# Patient Record
Sex: Male | Born: 1965 | Race: White | Hispanic: No | Smoking: Current every day smoker
Health system: Southern US, Community
[De-identification: ages and names within clinical notes are randomized; demographics above are authoritative.]

## PROBLEM LIST (undated history)

## (undated) DIAGNOSIS — G629 Polyneuropathy, unspecified: Secondary | ICD-10-CM

## (undated) DIAGNOSIS — S82891A Other fracture of right lower leg, initial encounter for closed fracture: Secondary | ICD-10-CM

## (undated) HISTORY — PX: FACIAL RECONSTRUCTION SURGERY: SHX631

## (undated) HISTORY — PX: OTHER SURGICAL HISTORY: SHX169

---

## 2007-05-06 ENCOUNTER — Emergency Department: Payer: Self-pay | Admitting: Internal Medicine

## 2007-07-28 ENCOUNTER — Other Ambulatory Visit: Payer: Self-pay

## 2007-07-28 ENCOUNTER — Emergency Department: Payer: Self-pay | Admitting: Emergency Medicine

## 2010-06-12 ENCOUNTER — Ambulatory Visit: Payer: Self-pay | Admitting: Family Medicine

## 2010-08-01 ENCOUNTER — Emergency Department: Payer: Self-pay | Admitting: Unknown Physician Specialty

## 2011-04-06 ENCOUNTER — Emergency Department: Payer: Self-pay | Admitting: *Deleted

## 2011-04-06 LAB — CK TOTAL AND CKMB (NOT AT ARMC): CK-MB: 1.4 ng/mL (ref 0.5–3.6)

## 2011-04-06 LAB — BASIC METABOLIC PANEL
Anion Gap: 10 (ref 7–16)
Calcium, Total: 9.8 mg/dL (ref 8.5–10.1)
Co2: 25 mmol/L (ref 21–32)
Creatinine: 0.88 mg/dL (ref 0.60–1.30)
EGFR (African American): >60
EGFR (Non-African Amer.): >60
Sodium: 141 mmol/L (ref 136–145)

## 2011-04-06 LAB — CBC
HCT: 44.2 % (ref 40.0–52.0)
HGB: 15.4 g/dL (ref 13.0–18.0)
MCH: 31.9 pg (ref 26.0–34.0)
MCHC: 34.8 g/dL (ref 32.0–36.0)
MCV: 92 fL (ref 80–100)
Platelet: 216 10*3/uL (ref 150–440)
RBC: 4.82 10*6/uL (ref 4.40–5.90)
WBC: 8.8 10*3/uL (ref 3.8–10.6)

## 2011-04-06 LAB — TROPONIN I: Troponin-I: <0.02 ng/mL

## 2014-08-08 ENCOUNTER — Inpatient Hospital Stay
Admit: 2014-08-08 | Discharge: 2014-08-08 | Disposition: A | Discharge status: Home / Self Care | Payer: Self-pay | Attending: Internal Medicine | Admitting: Internal Medicine

## 2014-08-08 ENCOUNTER — Inpatient Hospital Stay
Admission: EM | Admit: 2014-08-08 | Discharge: 2014-08-09 | DRG: 313 | Disposition: A | Discharge status: Home / Self Care | Payer: Self-pay | Attending: Internal Medicine | Admitting: Internal Medicine

## 2014-08-08 ENCOUNTER — Emergency Department: Payer: Self-pay

## 2014-08-08 DIAGNOSIS — R079 Chest pain, unspecified: Principal | ICD-10-CM | POA: Diagnosis present

## 2014-08-08 DIAGNOSIS — Z716 Tobacco abuse counseling: Secondary | ICD-10-CM | POA: Diagnosis present

## 2014-08-08 DIAGNOSIS — F17210 Nicotine dependence, cigarettes, uncomplicated: Secondary | ICD-10-CM | POA: Diagnosis present

## 2014-08-08 DIAGNOSIS — Z885 Allergy status to narcotic agent status: Secondary | ICD-10-CM

## 2014-08-08 DIAGNOSIS — Z880 Allergy status to penicillin: Secondary | ICD-10-CM

## 2014-08-08 DIAGNOSIS — Z8249 Family history of ischemic heart disease and other diseases of the circulatory system: Secondary | ICD-10-CM

## 2014-08-08 DIAGNOSIS — Z720 Tobacco use: Secondary | ICD-10-CM | POA: Diagnosis present

## 2014-08-08 DIAGNOSIS — R61 Generalized hyperhidrosis: Secondary | ICD-10-CM | POA: Diagnosis present

## 2014-08-08 DIAGNOSIS — R0602 Shortness of breath: Secondary | ICD-10-CM | POA: Diagnosis present

## 2014-08-08 LAB — LIPID PANEL
Cholesterol: 143 mg/dL (ref 0–200)
HDL: 38 mg/dL — AB (ref >40)
LDL Cholesterol: 84 mg/dL (ref 0–99)
Total CHOL/HDL Ratio: 3.8 RATIO
Triglycerides: 105 mg/dL (ref <150)
VLDL: 21 mg/dL (ref 0–40)

## 2014-08-08 LAB — TROPONIN I
Troponin I: <0.03 ng/mL (ref <0.031)
Troponin I: <0.03 ng/mL (ref <0.031)
Troponin I: <0.03 ng/mL (ref <0.031)

## 2014-08-08 LAB — BASIC METABOLIC PANEL
Anion gap: 9 (ref 5–15)
BUN: 12 mg/dL (ref 6–20)
CALCIUM: 8.9 mg/dL (ref 8.9–10.3)
CO2: 22 mmol/L (ref 22–32)
Chloride: 103 mmol/L (ref 101–111)
Creatinine, Ser: 0.76 mg/dL (ref 0.61–1.24)
GFR calc Af Amer: >60 mL/min (ref >60)
GFR calc non Af Amer: >60 mL/min (ref >60)
Glucose, Bld: 118 mg/dL — ABNORMAL HIGH (ref 65–99)
Potassium: 4 mmol/L (ref 3.5–5.1)
Sodium: 134 mmol/L — ABNORMAL LOW (ref 135–145)

## 2014-08-08 LAB — TSH: TSH: 1.989 u[IU]/mL (ref 0.350–4.500)

## 2014-08-08 LAB — CBC
HEMATOCRIT: 43.6 % (ref 40.0–52.0)
Hemoglobin: 15 g/dL (ref 13.0–18.0)
MCH: 31.5 pg (ref 26.0–34.0)
MCHC: 34.5 g/dL (ref 32.0–36.0)
MCV: 91.2 fL (ref 80.0–100.0)
Platelets: 206 10*3/uL (ref 150–440)
RBC: 4.78 MIL/uL (ref 4.40–5.90)
RDW: 13.5 % (ref 11.5–14.5)
WBC: 10.4 10*3/uL (ref 3.8–10.6)

## 2014-08-08 LAB — PROTIME-INR
INR: 0.95
PROTHROMBIN TIME: 12.9 s (ref 11.4–15.0)

## 2014-08-08 LAB — APTT: aPTT: 29 seconds (ref 24–36)

## 2014-08-08 LAB — HEPARIN LEVEL (UNFRACTIONATED): Heparin Unfractionated: 0.26 IU/mL — ABNORMAL LOW (ref 0.30–0.70)

## 2014-08-08 MED ORDER — SODIUM CHLORIDE 0.9 % IJ SOLN: 3.0000 mL | INTRAMUSCULAR | Status: DC | PRN

## 2014-08-08 MED ORDER — SODIUM CHLORIDE 0.9 % IV SOLN: 250.0000 mL | INTRAVENOUS | Status: DC | PRN

## 2014-08-08 MED ORDER — ONDANSETRON HCL 4 MG PO TABS: 4.0000 mg | ORAL_TABLET | Freq: Four times a day (QID) | ORAL | Status: DC | PRN

## 2014-08-08 MED ORDER — ONDANSETRON HCL 4 MG/2ML IJ SOLN: 4.0000 mg | Freq: Four times a day (QID) | INTRAMUSCULAR | Status: DC | PRN

## 2014-08-08 MED ORDER — HYDROMORPHONE HCL 1 MG/ML IJ SOLN: 1.0000 mg | INTRAMUSCULAR | Status: DC | PRN

## 2014-08-08 MED ORDER — SODIUM CHLORIDE 0.9 % IJ SOLN: 3.0000 mL | Freq: Two times a day (BID) | INTRAMUSCULAR | Status: DC

## 2014-08-08 MED ORDER — PNEUMOCOCCAL VAC POLYVALENT 25 MCG/0.5ML IJ INJ: 0.5000 mL | INJECTION | INTRAMUSCULAR | Status: DC

## 2014-08-08 MED ORDER — METOPROLOL TARTRATE 25 MG PO TABS
12.5000 mg | ORAL_TABLET | Freq: Two times a day (BID) | ORAL | Status: DC
  Administered 2014-08-08: 12.5 mg via ORAL
  Filled 2014-08-08: qty 1

## 2014-08-08 MED ORDER — NITROGLYCERIN 0.4 MG SL SUBL
0.4000 mg | SUBLINGUAL_TABLET | Freq: Once | SUBLINGUAL | Status: AC
  Administered 2014-08-08: 0.4 mg via SUBLINGUAL
  Filled 2014-08-08: qty 1

## 2014-08-08 MED ORDER — HEPARIN BOLUS VIA INFUSION
4000.0000 [IU] | Freq: Once | INTRAVENOUS | Status: AC
  Administered 2014-08-08: 4000 [IU] via INTRAVENOUS
  Filled 2014-08-08: qty 4000

## 2014-08-08 MED ORDER — HYDROMORPHONE HCL 1 MG/ML IJ SOLN
1.0000 mg | Freq: Once | INTRAMUSCULAR | Status: AC
  Administered 2014-08-08: 1 mg via INTRAVENOUS

## 2014-08-08 MED ORDER — HEPARIN (PORCINE) IN NACL 100-0.45 UNIT/ML-% IJ SOLN
1500.0000 [IU]/h | INTRAMUSCULAR | Status: DC
  Administered 2014-08-08: 1300 [IU]/h via INTRAVENOUS
  Filled 2014-08-08 (×4): qty 250

## 2014-08-08 MED ORDER — HYDROMORPHONE HCL 1 MG/ML IJ SOLN
1.0000 mg | Freq: Once | INTRAMUSCULAR | Status: AC
  Administered 2014-08-08: 1 mg via INTRAVENOUS
  Filled 2014-08-08: qty 1

## 2014-08-08 MED ORDER — ACETAMINOPHEN 650 MG RE SUPP: 650.0000 mg | Freq: Four times a day (QID) | RECTAL | Status: DC | PRN

## 2014-08-08 MED ORDER — ONDANSETRON HCL 4 MG/2ML IJ SOLN
4.0000 mg | Freq: Once | INTRAMUSCULAR | Status: AC
  Administered 2014-08-08: 4 mg via INTRAVENOUS

## 2014-08-08 MED ORDER — HYDROMORPHONE HCL 1 MG/ML IJ SOLN
INTRAMUSCULAR | Status: AC
  Administered 2014-08-08: 1 mg via INTRAVENOUS
  Filled 2014-08-08: qty 1

## 2014-08-08 MED ORDER — ASPIRIN 81 MG PO CHEW
324.0000 mg | CHEWABLE_TABLET | Freq: Once | ORAL | Status: AC
  Administered 2014-08-08: 324 mg via ORAL
  Filled 2014-08-08: qty 4

## 2014-08-08 MED ORDER — ACETAMINOPHEN 325 MG PO TABS: 650.0000 mg | ORAL_TABLET | Freq: Four times a day (QID) | ORAL | Status: DC | PRN

## 2014-08-08 MED ORDER — ASPIRIN EC 81 MG PO TBEC: 81.0000 mg | DELAYED_RELEASE_TABLET | Freq: Every day | ORAL | Status: DC

## 2014-08-08 MED ORDER — ONDANSETRON HCL 4 MG/2ML IJ SOLN
INTRAMUSCULAR | Status: AC
  Administered 2014-08-08: 4 mg via INTRAVENOUS
  Filled 2014-08-08: qty 2

## 2014-08-08 MED ORDER — HEPARIN BOLUS VIA INFUSION
1500.0000 [IU] | Freq: Once | INTRAVENOUS | Status: AC
  Administered 2014-08-08: 1500 [IU] via INTRAVENOUS
  Filled 2014-08-08: qty 1500

## 2014-08-08 NOTE — ED Notes (Signed)
Patient transported to X-ray 

## 2014-08-08 NOTE — Progress Notes (Signed)
Patient alert and oriented x4. Oriented to room, unit, and call bell. Admission completed. No complaints at this time. Will cont to assess. Skin assessment verified by Mya Woodard, RN. Telemetry box verified. Manny Vitolo R [place]  

## 2014-08-08 NOTE — ED Notes (Signed)
Admitting MD at bedside.

## 2014-08-08 NOTE — Progress Notes (Signed)
ANTICOAGULATION CONSULT NOTE - Follow up Consult  Pharmacy Consult for Heparin Indication: chest pain/ACS  Allergies  Allergen Reactions  . Morphine And Related Shortness Of Breath, Itching and Swelling  . Penicillins Anaphylaxis    Patient Measurements: Height:  (190.5 cm) Weight: 218 lb (98.884 kg) IBW/kg (Calculated) : 84.5 Heparin Dosing Weight: 98.9 kg  Vital Signs: Temp: 97.9 F (36.6 C) (08/02 2004) Temp Source: Oral (08/02 1556) BP: 129/75 mmHg (08/02 2004) Pulse Rate: 117 (08/02 2004)  Labs:  Recent Labs  08/08/14 1137 08/08/14 1604 08/08/14 2008  HGB 15.0  --   --   HCT 43.6  --   --   PLT 206  --   --   APTT 29  --   --   LABPROT 12.9  --   --   INR 0.95  --   --   HEPARINUNFRC  --   --  0.26*  CREATININE 0.76  --   --   TROPONINI <0.03 <0.03  --     Estimated Creatinine Clearance: 136.4 mL/min (by C-G formula based on Cr of 0.76).   Medical History: History reviewed. No pertinent past medical history.  Medications:  Infusions:  . heparin 1,300 Units/hr (08/08/14 2000)    Assessment: 49 yo male admitted with sudden onset constant sharp chest pain radiating to jaw and shoulder.  No personal or family history of PE or DVT.  First level subtherapeutic 0.26 - RN confirmed heparin running at 13 ml/hr, no line issues, no bleeding noted  Goal of Therapy:  Heparin level 0.3-0.7 units/ml    Plan:  Will order heparin 1500 IV x1 bolus then increase drip to 1500 units/hr (=15 ml/hr) 6-h heparin level - 8/3 at 0400 CBC in AM  Crist Fat L 08/08/2014,8:53 PM

## 2014-08-08 NOTE — ED Notes (Signed)
Pt vomiting in room, states pain 6/10. MD to bedside.

## 2014-08-08 NOTE — H&P (Signed)
[facility] [place] [facility]   PATIENT NAME: Douglas Braun    MR#:  [id number]  DATE OF BIRTH:  01-06-1966  DATE OF ADMISSION:  08/08/2014  PRIMARY CARE PHYSICIAN: No PCP Per Patient   REQUESTING/REFERRING PHYSICIAN: Chari Manning  CHIEF COMPLAINT:   Chief Complaint  Patient presents with  . Chest Pain   left-sided chest pain with radiation to left jaw and shoulder with associated shortness of breath, nausea and diaphoresis.  HISTORY OF PRESENT ILLNESS:  Douglas Braun  is a 49 y.o. male with no significant past medical history except for history of tobacco usage presents to the emergency room with the complaints of acute onset of left-sided chest pain with radiation to left jaw and left shoulder with associated shortness of breath, diaphoresis and nausea which happened while he was at rest. No associated dizziness or palpitations. No history of any recent fever, chills, cough, GERD symptoms. No history of any chest pain in the past. In the emergency room patient was evaluated by the ED physician and was found to be with stable vital signs and received aspirin, sublingual nitroglycerin and IV Dilaudid following which his left-sided chest pain decreased from 10/10 -2/10. Patient has strong family history of premature coronary artery disease. Workup revealed normal labs, troponin less than 0.03, chest x-ray unremarkable for any acute cardiopulmonary pathology. EKG normal sinus rhythm with ventricular rate of 73 bpm, poor R-wave progression in anterior septal leads.  PAST MEDICAL HISTORY:  History reviewed. No pertinent past medical history.  PAST SURGICAL HISTORY:   Past Surgical History  Procedure Laterality Date  . Facial reconstruction surgery      SOCIAL HISTORY:   History  Substance Use Topics  . Smoking status: Current Every Day Smoker -- 1.00 packs/day    Types: Cigarettes  . Smokeless tobacco: Never Used  . Alcohol Use: No    FAMILY  HISTORY:   Family History  Problem Relation Age of Onset  . Heart attack Mother   . Heart attack Other     DRUG ALLERGIES:   Allergies  Allergen Reactions  . Morphine And Related Shortness Of Breath, Itching and Swelling  . Penicillins Anaphylaxis    REVIEW OF SYSTEMS:   Review of Systems  Constitutional: Negative for fever, chills and malaise/fatigue.  HENT: Negative for ear pain, hearing loss, nosebleeds, sore throat and tinnitus.   Eyes: Negative for blurred vision, double vision, pain, discharge and redness.  Respiratory: Positive for shortness of breath. Negative for cough, hemoptysis, sputum production and wheezing.   Cardiovascular: Positive for chest pain. Negative for palpitations, orthopnea and leg swelling.       With associated shortness of breath, diaphoresis, nausea.  Gastrointestinal: Negative for nausea, vomiting, abdominal pain, diarrhea, constipation, blood in stool and melena.  Genitourinary: Negative for dysuria, urgency, frequency and hematuria.  Musculoskeletal: Negative for back pain, joint pain and neck pain.  Skin: Negative for itching and rash.  Neurological: Negative for dizziness, tingling, sensory change, focal weakness and seizures.  Endo/Heme/Allergies: Does not bruise/bleed easily.  Psychiatric/Behavioral: Negative for depression. The patient is not nervous/anxious.     MEDICATIONS AT HOME:   Prior to Admission medications   Medication Sig Start Date End Date Taking? Authorizing Provider  ibuprofen (ADVIL,MOTRIN) 200 MG tablet Take 800 mg by mouth every 6 (six) hours as needed for mild pain.   Yes Historical Provider, MD      VITAL SIGNS:  Blood pressure 125/85, pulse 53, temperature 98.1 F (36.7  C), temperature source Oral, resp. rate 14, height  (1.905 m), weight 98.884 kg (218 lb), SpO2 96 %.  PHYSICAL EXAMINATION:  Physical Exam  Constitutional: He is oriented to person, place, and time. He appears well-developed and  well-nourished. No distress.  HENT:  Head: Normocephalic and atraumatic.  Right Ear: External ear normal.  Left Ear: External ear normal.  Nose: Nose normal.  Mouth/Throat: Oropharynx is clear and moist. No oropharyngeal exudate.  Eyes: EOM are normal. Pupils are equal, round, and reactive to light. No scleral icterus.  Neck: Normal range of motion. Neck supple. No JVD present. No thyromegaly present.  Cardiovascular: Normal rate, regular rhythm, normal heart sounds and intact distal pulses.  Exam reveals no friction rub.   No murmur heard. Respiratory: Effort normal and breath sounds normal. No respiratory distress. He has no wheezes. He has no rales. He exhibits no tenderness.  GI: Soft. Bowel sounds are normal. He exhibits no distension and no mass. There is no tenderness. There is no rebound and no guarding.  Musculoskeletal: Normal range of motion. He exhibits no edema.  Lymphadenopathy:    He has no cervical adenopathy.  Neurological: He is alert and oriented to person, place, and time. He has normal reflexes. He displays normal reflexes. No cranial nerve deficit. He exhibits normal muscle tone.  Skin: Skin is warm. No rash noted. No erythema.  Psychiatric: He has a normal mood and affect. His behavior is normal. Thought content normal.   LABORATORY PANEL:   CBC  Recent Labs Lab 08/08/14 1137  WBC 10.4  HGB 15.0  HCT 43.6  PLT 206   ------------------------------------------------------------------------------------------------------------------  Chemistries   Recent Labs Lab 08/08/14 1137  NA 134*  K 4.0  CL 103  CO2 22  GLUCOSE 118*  BUN 12  CREATININE 0.76  CALCIUM 8.9   ------------------------------------------------------------------------------------------------------------------  Cardiac Enzymes  Recent Labs Lab 08/08/14 1137  TROPONINI <0.03    ------------------------------------------------------------------------------------------------------------------  RADIOLOGY:  Dg Chest 2 View  08/08/2014   CLINICAL DATA:  Chest pain  EXAM: CHEST  2 VIEW  COMPARISON:  04/06/2011  FINDINGS: Cardiomediastinal silhouette is stable. No acute infiltrate or pleural effusion. No pulmonary edema. Bony thorax is unremarkable.  IMPRESSION: No active cardiopulmonary disease.   Electronically Signed   By: Natasha Mead M.D.   On: 08/08/2014 12:29    EKG:   Orders placed or performed during the hospital encounter of 08/08/14  . ED EKG within 10 minutes  . ED EKG within 10 minutes normal sinus rhythm with ventricular rate of 73 bpm, poor R-wave progression in anterior septal leads.     IMPRESSION AND PLAN:  1. Left-sided chest pain with radiation to left arm and jaw with associated shortness of breath, diaphoresis and nausea occurring at rest. Strong family history of coronary artery disease +. Patient active smoker. Chest pain concerning for acute coronary event in the setting of above risk factors. Plan: Admit to telemetry, continue IV heparin, aspirin, sublingual nitroglycerin. Start low-dose beta blocker, cycle cardiac enzymes, check lipids. Request echocardiogram and cardiac consultation for further evaluation and advice. 2. Active smoker. Counseled to quit smoking, patient not motivated this time.     All the records are reviewed and case discussed with ED provider. Management plans discussed with the patient, family and they are in agreement.  CODE STATUS: Full code  TOTAL TIME TAKING CARE OF THIS PATIENT: 50  minutes.    Crissie Figures M.D on 08/08/2014 at 2:08 PM  Between 7am  to 6pm - Pager - [phone]  After 6pm go to [email or web address] - password [email or web address] [facility]  [facility]  Office  [phone]  CC: Primary care physician; No PCP Per Patient

## 2014-08-08 NOTE — Progress Notes (Signed)
ANTICOAGULATION CONSULT NOTE - Initial Consult  Pharmacy Consult for Heparin Indication: chest pain/ACS  Allergies  Allergen Reactions  . Morphine And Related Shortness Of Breath, Itching and Swelling  . Penicillins Anaphylaxis    Patient Measurements: Height:  (190.5 cm) Weight: 218 lb (98.884 kg) IBW/kg (Calculated) : 84.5 Heparin Dosing Weight: 98.9 kg  Vital Signs: Temp: 98.1 F (36.7 C) (08/02 1131) Temp Source: Oral (08/02 1131) BP: 125/85 mmHg (08/02 1330) Pulse Rate: 53 (08/02 1330)  Labs:  Recent Labs  08/08/14 1137  HGB 15.0  HCT 43.6  PLT 206  APTT 29  LABPROT 12.9  INR 0.95  CREATININE 0.76  TROPONINI <0.03    Estimated Creatinine Clearance: 136.4 mL/min (by C-G formula based on Cr of 0.76).   Medical History: History reviewed. No pertinent past medical history.  Medications:  Infusions:  . heparin      Assessment: 49 yo male admitted with sudden onset constant sharp chest pain radiating to jaw and shoulder.  No personal or family history of PE or DVT.  Goal of Therapy:  Heparin level 0.3-0.7 units/ml    Plan:  Give 4000 units bolus x 1 Start heparin infusion at 1300 units/hr Check anti-Xa level in 6 hours and daily while on heparin Continue to monitor H&H and platelets  Terris Germano K 08/08/2014,1:53 PM

## 2014-08-08 NOTE — ED Notes (Signed)
Pt c/o sudden onset left sided chest pain that radiates into the neck with SOB, nausea, diaphoresis that started about 15-82min ago

## 2014-08-08 NOTE — Care Management (Signed)
Order present for care management consult for home health needs.  Patient presents from home.  Prior to this admission, patient independent in all adls, No previous medical history.  Admitted as inpatient with chest pain concerning for angina.  First troponin is negative.   No PCP.  There has been "no need."  Has been placed on heparin drip and cardiology consult is pending.

## 2014-08-08 NOTE — ED Provider Notes (Signed)
[facility] Emergency Department Provider Note  ____________________________________________  Time seen: Approximately 12:16 PM  I have reviewed the triage vital signs and the nursing notes.   HISTORY  Chief Complaint Chest Pain    HPI Douglas Braun is a 49 y.o. male with no chronic medical problems, tobacco use, presents for evaluation of sudden onset constant left-sided sharp chest pain radiating into the left jaw and left shoulder, beginning less than an hour ago. It is associated with shortness of breath, nausea and diaphoresis. It began at rest. Patient has never had this type of pain before. Current severity of symptoms is 10 out of 10. Pain is not ripping or tearing in nature, does not radiate to the back or down towards the feet. No modifying factors. Prior to today he had been in his usual state of health without illness, no cough, runny nose, vomiting, diarrhea, fevers or chills. No personal or family history of PE or DVT. Strong family history of coronary artery disease.   History reviewed. No pertinent past medical history.  There are no active problems to display for this patient.   Past Surgical History  Procedure Laterality Date  . Facial reconstruction surgery      No current outpatient prescriptions on file.  Allergies Penicillins and Morphine and related  No family history on file.  Social History History  Substance Use Topics  . Smoking status: Current Every Day Smoker -- 1.00 packs/day    Types: Cigarettes  . Smokeless tobacco: Never Used  . Alcohol Use: No    Review of Systems Constitutional: No fever/chills Eyes: No visual changes. ENT: No sore throat. Cardiovascular: + chest pain. Respiratory: + shortness of breath. Gastrointestinal: No abdominal pain.  + nausea, no vomiting.  No diarrhea.  No constipation. Genitourinary: Negative for dysuria. Musculoskeletal: Negative for back pain. Skin: Negative for  rash. Neurological: Negative for headaches, focal weakness or numbness.  10-point ROS otherwise negative.  ____________________________________________   PHYSICAL EXAM:  VITAL SIGNS: ED Triage Vitals  Enc Vitals Group     BP 08/08/14 1131 137/77 mmHg     Pulse Rate 08/08/14 1131 72     Resp 08/08/14 1131 24     Temp 08/08/14 1131 98.1 F (36.7 C)     Temp Source 08/08/14 1131 Oral     SpO2 08/08/14 1131 98 %     Weight 08/08/14 1131 218 lb (98.884 kg)     Height 08/08/14 1131 6\' 3"  (1.905 m)     Head Cir --      Peak Flow --      Pain Score 08/08/14 1132 6     Pain Loc --      Pain Edu? --      Excl. in GC? --     Constitutional: Alert and oriented. In distress secondary to pain. Eyes: Conjunctivae are normal. PERRL. EOMI. Head: Atraumatic. Nose: No congestion/rhinnorhea. Mouth/Throat: Mucous membranes are moist.  Oropharynx non-erythematous. Neck: No stridor.  Cardiovascular: Normal rate, regular rhythm. Grossly normal heart sounds.  Good peripheral circulation. Respiratory: Normal respiratory effort.  No retractions. Lungs CTAB. Gastrointestinal: Soft and nontender. No distention. No abdominal bruits. No CVA tenderness. Genitourinary: deferred Musculoskeletal: No lower extremity tenderness nor edema.  No joint effusions. Neurologic:  Normal speech and language. No gross focal neurologic deficits are appreciated. No gait instability. Skin:  Skin is warm, dry and intact. No rash noted. Psychiatric: Mood and affect are normal. Speech and behavior are normal.  ____________________________________________  LABS (all labs ordered are listed, but only abnormal results are displayed)  Labs Reviewed  BASIC METABOLIC PANEL - Abnormal; Notable for the following:    Sodium 134 (*)    Glucose, Bld 118 (*)    All other components within normal limits  CBC  TROPONIN I  PROTIME-INR  APTT   ____________________________________________  EKG  ED ECG REPORT I, Gayla Doss, the attending physician, personally viewed and interpreted this ECG.   Date: 08/08/2014  EKG Time: 11:28  Rate: 73  Rhythm: normal sinus rhythm  Axis: normal  Intervals:none  ST&T Change: No acute ST elevation. Poor R-wave progression in the anteroseptal leads.  ____________________________________________  RADIOLOGY  CXR FINDINGS: Cardiomediastinal silhouette is stable. No acute infiltrate or pleural effusion. No pulmonary edema. Bony thorax is unremarkable.  IMPRESSION: No active cardiopulmonary disease.  ____________________________________________   PROCEDURES  Procedure(s) performed: None  Critical Care performed: No  ____________________________________________   INITIAL IMPRESSION / ASSESSMENT AND PLAN / ED COURSE  Pertinent labs & imaging results that were available during my care of the patient were reviewed by me and considered in my medical decision making (see chart for details).  Douglas Braun is a 49 y.o. male with no chronic medical problems, tobacco use, presents for evaluation of sudden onset constant left-sided sharp chest pain radiating into the left jaw and left shoulder. On exam, he is in distress secondary to pain. EKG shows poor R-wave progression, he is a smoker has a strong family history of coronary artery disease beginning at the age of 65 therefore my concern is for ACS. Pain is not pleuritic in nature, he is not hypoxic making PE less likely. He is mildly tachypnea but I suspect this is related to pain. Pain is not ripping or tearing in nature, not radiating to the back or down towards the feet making acute aortic dissection less likely. Pain improved to 2 out of 10 at this time after Dilaudid and SL nitroglycerin. We'll continue with pain management, start heparin drip (no history of GI/cerebral hemorrhage or any other bleeding complications). Case discussed with hospitalist Dr. Betti Cruz for admission at 1:30  PM. ____________________________________________   FINAL CLINICAL IMPRESSION(S) / ED DIAGNOSES  Final diagnoses:  Chest pain, unspecified chest pain type      Gayla Doss, MD 08/08/14 1336

## 2014-08-08 NOTE — Consult Note (Signed)
[facility]  CARDIOLOGY CONSULT NOTE  Patient ID: Douglas Braun MRN: [id number] DOB/AGE: Oct 14, 1965 49 y.o.  Admit date: 08/08/2014 Referring Physician Betti Cruz Primary Physician no local M.D. Primary Cardiologist  Reason for Consultation chest pain  HPI: 49 year old gentleman referred for evaluation chest pain. Patient has no chronic medical problems, history of diabetes or hypertension. The patient does smoke. He presents with left-sided sharp discomfort with radiation to his left shoulder and jaw. Admission ECG  was nondiagnostic. The patient rated his chest pain 8 out of 10. The patient has ruled out for myocardial infarction by CPK isoenzymes and troponin. This pain has improved, now rated 3/10.  Review of systems complete and found to be negative unless listed above     History reviewed. No pertinent past medical history.  Past Surgical History  Procedure Laterality Date  . Facial reconstruction surgery      Prescriptions prior to admission  Medication Sig Dispense Refill Last Dose  . ibuprofen (ADVIL,MOTRIN) 200 MG tablet Take 800 mg by mouth every 6 (six) hours as needed for mild pain.   08/07/2014 at Unsure    History   Social History  . Marital Status: Widowed    Spouse Name: N/A  . Number of Children: N/A  . Years of Education: N/A   Occupational History  . Not on file.   Social History Main Topics  . Smoking status: Current Every Day Smoker -- 1.00 packs/day    Types: Cigarettes  . Smokeless tobacco: Never Used  . Alcohol Use: No  . Drug Use: No  . Sexual Activity: Yes   Other Topics Concern  . Not on file   Social History Narrative  . No narrative on file    Family History  Problem Relation Age of Onset  . Heart attack Mother   . Heart attack Other       Review of systems complete and found to be negative unless listed above      PHYSICAL EXAM  General: Well developed, well nourished, in no acute distress HEENT:  Normocephalic and  atramatic Neck:  No JVD.  Lungs: Clear bilaterally to auscultation and percussion. Heart: HRRR . Normal S1 and S2 without gallops or murmurs.  Abdomen: Bowel sounds are positive, abdomen soft and non-tender  Msk:  Back normal, normal gait. Normal strength and tone for age. Extremities: No clubbing, cyanosis or edema.   Neuro: Alert and oriented X 3. Psych:  Good affect, responds appropriately  Labs:   Lab Results  Component Value Date   WBC 10.4 08/08/2014   HGB 15.0 08/08/2014   HCT 43.6 08/08/2014   MCV 91.2 08/08/2014   PLT 206 08/08/2014    Recent Labs Lab 08/08/14 1137  NA 134*  K 4.0  CL 103  CO2 22  BUN 12  CREATININE 0.76  CALCIUM 8.9  GLUCOSE 118*   Lab Results  Component Value Date   TROPONINI <0.03 08/08/2014   No results found for: CHOL No results found for: HDL No results found for: LDLCALC No results found for: TRIG No results found for: CHOLHDL No results found for: LDLDIRECT    Radiology: Dg Chest 2 View  08/08/2014   CLINICAL DATA:  Chest pain  EXAM: CHEST  2 VIEW  COMPARISON:  04/06/2011  FINDINGS: Cardiomediastinal silhouette is stable. No acute infiltrate or pleural effusion. No pulmonary edema. Bony thorax is unremarkable.  IMPRESSION: No active cardiopulmonary disease.   Electronically Signed   By: Lanette Hampshire.D.  On: 08/08/2014 12:29    EKG: Normal sinus rhythm  ASSESSMENT AND PLAN:   49 year old gentleman without significant past medical history with exception tobacco abuse who presents with chest pain with typical and atypical features, nondiagnostic ECG with negative troponin.  Recommendations  1. Continue current medications 2. Proceed with ETT sestamibi study in a.m.  SignedMarcina Millard MD,PhD, [facility] 08/08/2014, 5:34 PM

## 2014-08-09 ENCOUNTER — Inpatient Hospital Stay: Payer: Self-pay

## 2014-08-09 LAB — NM MYOCAR MULTI W/SPECT W/WALL MOTION / EF
CHL CUP NUCLEAR SDS: 0
CHL CUP NUCLEAR SSS: 0
CSEPED: 6 min
CSEPEW: 7 METS
Exercise duration (sec): 0 s
LV dias vol: 134 mL
LV sys vol: 48 mL
NUC STRESS TID: 0.91
Peak HR: 113 {beats}/min
Rest HR: 57 {beats}/min
SRS: 0

## 2014-08-09 LAB — CBC
HCT: 41.6 % (ref 40.0–52.0)
Hemoglobin: 14.3 g/dL (ref 13.0–18.0)
MCH: 31.5 pg (ref 26.0–34.0)
MCHC: 34.3 g/dL (ref 32.0–36.0)
MCV: 91.6 fL (ref 80.0–100.0)
Platelets: 174 10*3/uL (ref 150–440)
RBC: 4.54 MIL/uL (ref 4.40–5.90)
RDW: 14.1 % (ref 11.5–14.5)
WBC: 8.5 10*3/uL (ref 3.8–10.6)

## 2014-08-09 LAB — TROPONIN I: Troponin I: <0.03 ng/mL (ref <0.031)

## 2014-08-09 LAB — HEPARIN LEVEL (UNFRACTIONATED): Heparin Unfractionated: 0.36 IU/mL (ref 0.30–0.70)

## 2014-08-09 MED ORDER — TECHNETIUM TC 99M SESTAMIBI - CARDIOLITE
33.0000 | Freq: Once | INTRAVENOUS | Status: AC | PRN
  Administered 2014-08-09: 31.96 via INTRAVENOUS

## 2014-08-09 MED ORDER — TECHNETIUM TC 99M SESTAMIBI - CARDIOLITE
13.0000 | Freq: Once | INTRAVENOUS | Status: AC | PRN
  Administered 2014-08-09: 13.38 via INTRAVENOUS

## 2014-08-09 NOTE — Care Management (Signed)
Troponins negative.  For ETT study today

## 2014-08-09 NOTE — Progress Notes (Signed)
Removed telemetry and removed iv.  No new rx.  Advised to quit smoking.  F/u with linthavong.

## 2014-08-09 NOTE — Discharge Summary (Signed)
[facility] [place] [facility]  DISCHARGE SUMMARY   PATIENT NAME: Douglas Braun    MR#:  [id number]  DATE OF BIRTH:  1965-03-06  DATE OF ADMISSION:  08/08/2014 ADMITTING PHYSICIAN: Crissie Figures, MD  DATE OF DISCHARGE: 08/09/2014  PRIMARY CARE PHYSICIAN: No PCP Per Patient    ADMISSION DIAGNOSIS:  Chest pain, unspecified chest pain type [R07.9]  DISCHARGE DIAGNOSIS:  Principal Problem:   Chest pain with high risk for cardiac etiology Active Problems:   Tobacco use   SECONDARY DIAGNOSIS:  History reviewed. No pertinent past medical history.  HOSPITAL COURSE:   #1 chest pain: Patient admitted with left-sided chest pain radiating to the jaw and left shoulder. He did have a risk factor of smoking and lack of primary care. He has had 3 negative sets of cardiac enzymes. EKG unchanged. No events on telemetry. He has had a treadmill stress test this morning which was low risk. 2-D echocardiogram is normal. He has ruled out for acute coronary syndrome. Chest x-ray is negative. Lipid panel is okay. He was started on a heparin drip on admission, this was discontinued after normal stress testing.  #2 ongoing tobacco abuse: He was counseled for 3-5 minutes on smoking cessation.    DISCHARGE CONDITIONS:   Stable  CONSULTS OBTAINED:  Treatment Team:  Marcina Millard, MD  DRUG ALLERGIES:   Allergies  Allergen Reactions  . Morphine And Related Shortness Of Breath, Itching and Swelling  . Penicillins Anaphylaxis    DISCHARGE MEDICATIONS:   Current Discharge Medication List    STOP taking these medications     ibuprofen (ADVIL,MOTRIN) 200 MG tablet          DISCHARGE INSTRUCTIONS:   You should stop smoking.   DIET:  Regular diet  DISCHARGE CONDITION:  Fair  ACTIVITY:  Activity as tolerated  OXYGEN:  Home Oxygen: No.   Oxygen Delivery: room air  DISCHARGE LOCATION:  home   If you experience worsening of your  admission symptoms, develop shortness of breath, life threatening emergency, suicidal or homicidal thoughts you must seek medical attention immediately by calling 911 or calling your MD immediately  if symptoms less severe.  You Must read complete instructions/literature along with all the possible adverse reactions/side effects for all the Medicines you take and that have been prescribed to you. Take any new Medicines after you have completely understood and accpet all the possible adverse reactions/side effects.   Please note  You were cared for by a hospitalist during your hospital stay. If you have any questions about your discharge medications or the care you received while you were in the hospital after you are discharged, you can call the unit and asked to speak with the hospitalist on call if the hospitalist that took care of you is not available. Once you are discharged, your primary care physician will handle any further medical issues. Please note that NO REFILLS for any discharge medications will be authorized once you are discharged, as it is imperative that you return to your primary care physician (or establish a relationship with a primary care physician if you do not have one) for your aftercare needs so that they can reassess your need for medications and monitor your lab values.   Today   CHIEF COMPLAINT:   Chief Complaint  Patient presents with  . Chest Pain    HISTORY OF PRESENT ILLNESS:   Douglas Braun is a 49 y.o. male with no significant past medical history  except for history of tobacco usage presents to the emergency room with the complaints of acute onset of left-sided chest pain with radiation to left jaw and left shoulder with associated shortness of breath, diaphoresis and nausea which happened while he was at rest. No associated dizziness or palpitations. No history of any recent fever, chills, cough, GERD symptoms. No history of any chest pain in the past. In  the emergency room patient was evaluated by the ED physician and was found to be with stable vital signs and received aspirin, sublingual nitroglycerin and IV Dilaudid following which his left-sided chest pain decreased from 10/10 -2/10. Patient has strong family history of premature coronary artery disease. Workup revealed normal labs, troponin less than 0.03, chest x-ray unremarkable for any acute cardiopulmonary pathology. EKG normal sinus rhythm with ventricular rate of 73 bpm, poor R-wave progression in anterior septal leads.  VITAL SIGNS:  Blood pressure 124/72, pulse 55, temperature 98 F (36.7 C), temperature source Oral, resp. rate 18, height 6\' 3"  (1.905 m), weight 96.752 kg (213 lb 4.8 oz), SpO2 99 %.  I/O:   Intake/Output Summary (Last 24 hours) at 08/09/14 1331 Last data filed at 08/09/14 0700  Gross per 24 hour  Intake 222.58 ml  Output      0 ml  Net 222.58 ml    PHYSICAL EXAMINATION:  GENERAL:  49 y.o.-year-old patient lying in the bed with no acute distress. Thin EYES: Pupils equal, round, reactive to light and accommodation. No scleral icterus. Extraocular muscles intact.  HEENT: Head atraumatic, normocephalic. Oropharynx and nasopharynx clear.  NECK:  Supple, no jugular venous distention. No thyroid enlargement, no tenderness.  LUNGS: Normal breath sounds bilaterally, no wheezing, rales, rhonchi or crepitation. No use of accessory muscles of respiration.  CARDIOVASCULAR: S1, S2 normal. No murmurs, rubs, or gallops.  ABDOMEN: Soft, non-tender, non-distended. Bowel sounds present. No organomegaly or mass.  EXTREMITIES: No pedal edema, cyanosis, or clubbing.  NEUROLOGIC: Cranial nerves II through XII are intact. Muscle strength 5/5 in all extremities. Sensation intact. Gait not checked.  PSYCHIATRIC: The patient is alert and oriented x 3.  SKIN: No obvious rash, lesion, or ulcer.   DATA REVIEW:   CBC  Recent Labs Lab 08/09/14 0417  WBC 8.5  HGB 14.3  HCT 41.6   PLT 174    Chemistries   Recent Labs Lab 08/08/14 1137  NA 134*  K 4.0  CL 103  CO2 22  GLUCOSE 118*  BUN 12  CREATININE 0.76  CALCIUM 8.9    Cardiac Enzymes  Recent Labs Lab 08/09/14 0417  TROPONINI <0.03   Lab Results  Component Value Date   CHOL 143 08/08/2014   HDL 38* 08/08/2014   LDLCALC 84 08/08/2014   TRIG 105 08/08/2014   CHOLHDL 3.8 08/08/2014    Microbiology Results  No results found for this or any previous visit.  RADIOLOGY:  Dg Chest 2 View  08/08/2014   CLINICAL DATA:  Chest pain  EXAM: CHEST  2 VIEW  COMPARISON:  04/06/2011  FINDINGS: Cardiomediastinal silhouette is stable. No acute infiltrate or pleural effusion. No pulmonary edema. Bony thorax is unremarkable.  IMPRESSION: No active cardiopulmonary disease.   Electronically Signed   By: Natasha Mead M.D.   On: 08/08/2014 12:29   Nm Myocar Multi W/spect W/wall Motion / Ef  08/09/2014    There was no ST segment deviation noted during stress.  The study is normal.  This is a low risk study.  The left ventricular ejection  fraction is mildly decreased (45-54%).     EKG:   Orders placed or performed during the hospital encounter of 08/08/14  . ED EKG within 10 minutes  . ED EKG within 10 minutes  . EKG 12-Lead  . EKG 12-Lead      Management plans discussed with the patient, family and they are in agreement.  CODE STATUS:     Code Status Orders        Start     Ordered   08/08/14 1555  Full code   Continuous     08/08/14 1554      TOTAL TIME TAKING CARE OF THIS PATIENT: 35 minutes.  Greater than 50% of time spent in care coordination and counseling.  Elby Showers M.D on 08/09/2014 at 1:31 PM  Between 7am to 6pm - Pager - [phone]  After 6pm go to [email or web address] - password [email or web address] [facility]  [facility]  Office  [phone]  CC: Primary care physician; No PCP Per Patient

## 2014-08-09 NOTE — Progress Notes (Signed)
ANTICOAGULATION CONSULT NOTE - Follow Up Consult  Pharmacy Consult for Heparin Indication: chest pain/ACS  Allergies  Allergen Reactions  . Morphine And Related Shortness Of Breath, Itching and Swelling  . Penicillins Anaphylaxis    Patient Measurements: Height:  (190.5 cm) Weight: 218 lb (98.884 kg) IBW/kg (Calculated) : 84.5 Heparin Dosing Weight: 98.9 kg  Vital Signs: Temp: 98 F (36.7 C) (08/03 0436) BP: 111/73 mmHg (08/03 0436) Pulse Rate: 61 (08/03 0436)  Labs:  Recent Labs  08/08/14 1137 08/08/14 1604 08/08/14 2008 08/08/14 2221 08/09/14 0417  HGB 15.0  --   --   --  14.3  HCT 43.6  --   --   --  41.6  PLT 206  --   --   --  174  APTT 29  --   --   --   --   LABPROT 12.9  --   --   --   --   INR 0.95  --   --   --   --   HEPARINUNFRC  --   --  0.26*  --  0.36  CREATININE 0.76  --   --   --   --   TROPONINI <0.03 <0.03  --  <0.03 <0.03    Estimated Creatinine Clearance: 136.4 mL/min (by C-G formula based on Cr of 0.76).   Medications:  Scheduled:  . aspirin EC  81 mg Oral Daily  . metoprolol tartrate  12.5 mg Oral BID  . pneumococcal 23 valent vaccine  0.5 mL Intramuscular Tomorrow-1000  . sodium chloride  3 mL Intravenous Q12H  . sodium chloride  3 mL Intravenous Q12H   Infusions:  . heparin 1,500 Units/hr (08/08/14 2157)   PRN: sodium chloride, acetaminophen **OR** acetaminophen, HYDROmorphone (DILAUDID) injection, ondansetron **OR** ondansetron (ZOFRAN) IV, sodium chloride  Assessment: 49 yo male admitted with sudden onset constant sharp chest pain radiating to jaw and shoulder. No personal or family history of PE or DVT.  Goal of Therapy:  Heparin level 0.3-0.7 units/ml Monitor platelets by anticoagulation protocol: Yes   Plan:  Heparin level is at goal so will continue heparin infusion at 1500 units/hr and check a confirmatory HL in 6 hours.   Luisa Hart D 08/09/2014,4:55 AM

## 2014-08-09 NOTE — Progress Notes (Signed)
A&O. Independent. On heparin drip. For stress test today. No complaints. Slept all night.

## 2014-08-09 NOTE — Discharge Instructions (Signed)
You should stop smoking.   DIET:  Regular diet  DISCHARGE CONDITION:  Fair  ACTIVITY:  Activity as tolerated  OXYGEN:  Home Oxygen: No.   Oxygen Delivery: room air  DISCHARGE LOCATION:  home   If you experience worsening of your admission symptoms, develop shortness of breath, life threatening emergency, suicidal or homicidal thoughts you must seek medical attention immediately by calling 911 or calling your MD immediately  if symptoms less severe.  You Must read complete instructions/literature along with all the possible adverse reactions/side effects for all the Medicines you take and that have been prescribed to you. Take any new Medicines after you have completely understood and accpet all the possible adverse reactions/side effects.   Please note  You were cared for by a hospitalist during your hospital stay. If you have any questions about your discharge medications or the care you received while you were in the hospital after you are discharged, you can call the unit and asked to speak with the hospitalist on call if the hospitalist that took care of you is not available. Once you are discharged, your primary care physician will handle any further medical issues. Please note that NO REFILLS for any discharge medications will be authorized once you are discharged, as it is imperative that you return to your primary care physician (or establish a relationship with a primary care physician if you do not have one) for your aftercare needs so that they can reassess your need for medications and monitor your lab values.

## 2015-02-07 ENCOUNTER — Ambulatory Visit: Payer: Self-pay | Admitting: Internal Medicine

## 2015-02-14 ENCOUNTER — Ambulatory Visit: Payer: Self-pay | Admitting: Ophthalmology

## 2015-02-14 ENCOUNTER — Ambulatory Visit: Payer: Self-pay | Admitting: Internal Medicine

## 2015-02-28 ENCOUNTER — Ambulatory Visit: Payer: Self-pay | Admitting: Internal Medicine

## 2015-02-28 LAB — BASIC METABOLIC PANEL
BUN: 9 mg/dL (ref 4–21)
CREATININE: 0.9 mg/dL (ref 0.6–1.3)
GLUCOSE: 80 mg/dL
POTASSIUM: 4.9 mmol/L (ref 3.4–5.3)
SODIUM: 138 mmol/L (ref 137–147)

## 2015-02-28 LAB — HEPATIC FUNCTION PANEL
ALT: 18 U/L (ref 10–40)
AST: 17 U/L (ref 14–40)
Alkaline Phosphatase: 70 U/L (ref 25–125)
Bilirubin, Total: 0.3 mg/dL

## 2015-02-28 LAB — CBC AND DIFFERENTIAL
HEMATOCRIT: 47 % (ref 41–53)
Hemoglobin: 16.4 g/dL (ref 13.5–17.5)
Neutrophils Absolute: 6 /uL
Platelets: 253 10*3/uL (ref 150–399)
WBC: 10.3 10*3/mL

## 2015-02-28 LAB — TSH: TSH: 2.03 u[IU]/mL (ref 0.41–5.90)

## 2015-02-28 LAB — LIPID PANEL
Cholesterol: 175 mg/dL (ref 0–200)
HDL: 35 mg/dL (ref 35–70)
LDL Cholesterol: 82 mg/dL
Triglycerides: 288 mg/dL — AB (ref 40–160)

## 2015-03-28 ENCOUNTER — Other Ambulatory Visit: Payer: Self-pay

## 2016-01-09 ENCOUNTER — Emergency Department: Payer: Self-pay

## 2016-01-09 ENCOUNTER — Emergency Department
Admission: EM | Admit: 2016-01-09 | Discharge: 2016-01-09 | Disposition: A | Discharge status: Home / Self Care | Payer: Self-pay | Attending: Student in an Organized Health Care Education/Training Program | Admitting: Student in an Organized Health Care Education/Training Program

## 2016-01-09 ENCOUNTER — Encounter: Payer: Self-pay | Admitting: Emergency Medicine

## 2016-01-09 DIAGNOSIS — J40 Bronchitis, not specified as acute or chronic: Secondary | ICD-10-CM | POA: Insufficient documentation

## 2016-01-09 DIAGNOSIS — F17210 Nicotine dependence, cigarettes, uncomplicated: Secondary | ICD-10-CM | POA: Insufficient documentation

## 2016-01-09 DIAGNOSIS — R079 Chest pain, unspecified: Secondary | ICD-10-CM

## 2016-01-09 LAB — BASIC METABOLIC PANEL
Anion gap: 7 (ref 5–15)
BUN: 14 mg/dL (ref 6–20)
CALCIUM: 9.8 mg/dL (ref 8.9–10.3)
CO2: 25 mmol/L (ref 22–32)
CREATININE: 0.76 mg/dL (ref 0.61–1.24)
Chloride: 104 mmol/L (ref 101–111)
Glucose, Bld: 96 mg/dL (ref 65–99)
Potassium: 4 mmol/L (ref 3.5–5.1)
Sodium: 136 mmol/L (ref 135–145)

## 2016-01-09 LAB — CBC
HCT: 46.4 % (ref 40.0–52.0)
Hemoglobin: 16.3 g/dL (ref 13.0–18.0)
MCH: 31.3 pg (ref 26.0–34.0)
MCHC: 35.1 g/dL (ref 32.0–36.0)
MCV: 89 fL (ref 80.0–100.0)
PLATELETS: 243 10*3/uL (ref 150–440)
RBC: 5.21 MIL/uL (ref 4.40–5.90)
RDW: 14 % (ref 11.5–14.5)
WBC: 10.1 10*3/uL (ref 3.8–10.6)

## 2016-01-09 LAB — TROPONIN I: Troponin I: <0.03 ng/mL (ref <0.03)

## 2016-01-09 MED ORDER — NAPROXEN 500 MG PO TABS: 500.0000 mg | ORAL_TABLET | Freq: Two times a day (BID) | ORAL | 0 refills | Status: AC

## 2016-01-09 MED ORDER — PREDNISONE 20 MG PO TABS: 40.0000 mg | ORAL_TABLET | Freq: Every day | ORAL | 0 refills | Status: AC

## 2016-01-09 MED ORDER — IPRATROPIUM-ALBUTEROL 0.5-2.5 (3) MG/3ML IN SOLN
RESPIRATORY_TRACT | Status: AC
  Administered 2016-01-09: 3 mL via RESPIRATORY_TRACT
  Filled 2016-01-09: qty 6

## 2016-01-09 MED ORDER — ALBUTEROL SULFATE HFA 108 (90 BASE) MCG/ACT IN AERS: 2.0000 | INHALATION_SPRAY | Freq: Four times a day (QID) | RESPIRATORY_TRACT | 2 refills | Status: DC | PRN

## 2016-01-09 MED ORDER — IPRATROPIUM-ALBUTEROL 0.5-2.5 (3) MG/3ML IN SOLN
3.0000 mL | Freq: Once | RESPIRATORY_TRACT | Status: AC
  Administered 2016-01-09: 3 mL via RESPIRATORY_TRACT
  Filled 2016-01-09: qty 3

## 2016-01-09 MED ORDER — CYCLOBENZAPRINE HCL 10 MG PO TABS: 10.0000 mg | ORAL_TABLET | Freq: Three times a day (TID) | ORAL | 0 refills | Status: DC | PRN

## 2016-01-09 MED ORDER — PREDNISONE 20 MG PO TABS
60.0000 mg | ORAL_TABLET | Freq: Once | ORAL | Status: AC
  Administered 2016-01-09: 60 mg via ORAL
  Filled 2016-01-09: qty 3

## 2016-01-09 NOTE — ED Triage Notes (Signed)
Says 45 minutes chest pain.  Says was just sitting in a chair when it started.

## 2016-01-09 NOTE — ED Provider Notes (Signed)
[facility] Emergency Department Provider Note    First MD Initiated Contact with Patient 01/09/16 1524     (approximate)  I have reviewed the triage vital signs and the nursing notes.   HISTORY  Chief Complaint Chest Pain    HPI Douglas Braun is a 51 y.o. male who smokes over 2 packs of cigarettes daily presents with chest pain that started around 10 AM area didn't chest pain is located in his left anterior chest with radiation to his left shoulder. No radiation to his back or to his jaw. No associated nausea or vomiting. Denies any fevers. He arrives with his fianc states the patient was complaining of shortness of breath and having a dry cough starting yesterday. Denies any productive phlegm. No measured fevers. Denies any lower extremity swelling. No orthopnea.   History reviewed. No pertinent past medical history. Family History  Problem Relation Age of Onset  . Heart attack Mother   . Multiple sclerosis Father   . Heart attack Other    Past Surgical History:  Procedure Laterality Date  . FACIAL RECONSTRUCTION SURGERY    . Plastic Surgery of Right Ear     Patient Active Problem List   Diagnosis Date Noted  . Chest pain with high risk for cardiac etiology 08/08/2014  . Tobacco use 08/08/2014      Prior to Admission medications   Medication Sig Start Date End Date Taking? Authorizing Provider  albuterol (PROVENTIL HFA;VENTOLIN HFA) 108 (90 Base) MCG/ACT inhaler Inhale 2 puffs into the lungs every 6 (six) hours as needed for wheezing or shortness of breath. 01/09/16   Willy EddyPatrick Annamary Buschman, MD  cyclobenzaprine (FLEXERIL) 10 MG tablet Take 1 tablet (10 mg total) by mouth 3 (three) times daily as needed for muscle spasms. 01/09/16   Willy EddyPatrick Shy Guallpa, MD  naproxen (NAPROSYN) 500 MG tablet Take 1 tablet (500 mg total) by mouth 2 (two) times daily with a meal. 01/09/16 01/08/17  Willy EddyPatrick Osbaldo Mark, MD  predniSONE (DELTASONE) 20 MG tablet Take 2 tablets (40  mg total) by mouth daily. 01/09/16 01/14/16  Willy EddyPatrick Jayvan Mcshan, MD    Allergies Morphine and related and Penicillins    Social History Social History  Substance Use Topics  . Smoking status: Current Every Day Smoker    Packs/day: 1.00    Types: Cigarettes  . Smokeless tobacco: Never Used  . Alcohol use No    Review of Systems Patient denies headaches, rhinorrhea, blurry vision, numbness, shortness of breath, chest pain, edema, cough, abdominal pain, nausea, vomiting, diarrhea, dysuria, fevers, rashes or hallucinations unless otherwise stated above in HPI. ____________________________________________   PHYSICAL EXAM:  VITAL SIGNS: Vitals:   01/09/16 1243 01/09/16 1714  BP: 121/85 126/71  Pulse: 85 86  Resp: 18 18  Temp: 97.4 F (36.3 C)     Constitutional: Alert and oriented. in no acute distress. Eyes: Conjunctivae are normal. PERRL. EOMI. Head: Atraumatic. Nose: No congestion/rhinnorhea. Mouth/Throat: Mucous membranes are moist.  Oropharynx non-erythematous. Neck: No stridor. Painless ROM. No cervical spine tenderness to palpation Hematological/Lymphatic/Immunilogical: No cervical lymphadenopathy. Cardiovascular: Normal rate, regular rhythm. Grossly normal heart sounds.  Good peripheral circulation. Respiratory: Normal respiratory effort.  No retractions. Diminished breath sounds in bilateral bases with faint expiratory wheezing,  left anterior chest wall around superior aspect of pectoralis muscle is tender to palpation Gastrointestinal: Soft and nontender. No distention. No abdominal bruits. No CVA tenderness. Musculoskeletal: No lower extremity tenderness nor edema.  No joint effusions. Neurologic:  Normal speech and language.  No gross focal neurologic deficits are appreciated. No gait instability. Skin:  Skin is warm, dry and intact. No rash noted. Psychiatric: Mood and affect are normal. Speech and behavior are normal.  ____________________________________________    LABS (all labs ordered are listed, but only abnormal results are displayed)  Results for orders placed or performed during the hospital encounter of 01/09/16 (from the past 24 hour(s))  Basic metabolic panel     Status: None   Collection Time: 01/09/16 12:47 PM  Result Value Ref Range   Sodium 136 135 - 145 mmol/L   Potassium 4.0 3.5 - 5.1 mmol/L   Chloride 104 101 - 111 mmol/L   CO2 25 22 - 32 mmol/L   Glucose, Bld 96 65 - 99 mg/dL   BUN 14 6 - 20 mg/dL   Creatinine, Ser 1.61 0.61 - 1.24 mg/dL   Calcium 9.8 8.9 - 09.6 mg/dL   GFR calc non Af Amer >60 >60 mL/min   GFR calc Af Amer >60 >60 mL/min   Anion gap 7 5 - 15  CBC     Status: None   Collection Time: 01/09/16 12:47 PM  Result Value Ref Range   WBC 10.1 3.8 - 10.6 K/uL   RBC 5.21 4.40 - 5.90 MIL/uL   Hemoglobin 16.3 13.0 - 18.0 g/dL   HCT 04.5 40.9 - 81.1 %   MCV 89.0 80.0 - 100.0 fL   MCH 31.3 26.0 - 34.0 pg   MCHC 35.1 32.0 - 36.0 g/dL   RDW 91.4 78.2 - 95.6 %   Platelets 243 150 - 440 K/uL  Troponin I     Status: None   Collection Time: 01/09/16 12:47 PM  Result Value Ref Range   Troponin I <0.03 <0.03 ng/mL  Troponin I     Status: None   Collection Time: 01/09/16  4:23 PM  Result Value Ref Range   Troponin I <0.03 <0.03 ng/mL   ____________________________________________  EKG My review and personal interpretation at Time: 12:46   Indication: chest pain  Rate: 75  Rhythm: sinus Axis: normal Other: non specific st changes, poor r wave progression, no acute ischemia ____________________________________________  RADIOLOGY  I personally reviewed all radiographic images ordered to evaluate for the above acute complaints and reviewed radiology reports and findings.  These findings were personally discussed with the patient.  Please see medical record for radiology report. ____________________________________________   PROCEDURES  Procedure(s) performed:  Procedures    Critical Care performed:  no ____________________________________________   INITIAL IMPRESSION / ASSESSMENT AND PLAN / ED COURSE  Pertinent labs & imaging results that were available during my care of the patient were reviewed by me and considered in my medical decision making (see chart for details).  DDX: ACS, pericarditis, esophagitis, boerhaaves, pe, dissection, pna, bronchitis, costochondritis   Douglas Braun is a 51 y.o. who presents to the ED with chest pain and shortness of breath. The patient arrives afebrile hemodynamically stable. He is low risk by well's score and he is PERC negative.  Do not feel this is chronically consistent with pulmonary embolus.  Less consistent with ACS as his EKG is without evidence of acute ischemia and his troponin is negative. Patient with a low risk heart score of 3 but we'll further risk stratify with repeat troponin. Presentation most consistent with acute bronchitis. Chest x-ray shows no evidence of lobar pneumonia. This currently not consistent with dissection. We'll provide breathing treatments, steroids and monitoring.  Clinical Course as of Jan 08 1910  Wed Jan 09, 2016  1554 Troponin I: <0.03 [PR]  1707 Patient reassessed. Repeat troponin is negative. Patient with much improved air movement after inhaling treatments.  Do not feel this likely consistent with ACS. Due to this is consistent with chronic bronchitis and patient has follow-up with his primary care physician early next week.  Patient was able to tolerate PO and was able to ambulate with a steady gait.  Have discussed with the patient and available family all diagnostics and treatments performed thus far and all questions were answered to the best of my ability. The patient demonstrates understanding and agreement with plan.   [PR]    Clinical Course User Index [PR] Willy Eddy, MD     ____________________________________________   FINAL CLINICAL IMPRESSION(S) / ED DIAGNOSES  Final diagnoses:   Chest pain, unspecified type  Bronchitis      NEW MEDICATIONS STARTED DURING THIS VISIT:  There are no discharge medications for this patient.    Note:  This document was prepared using Dragon voice recognition software and may include unintentional dictation errors.    Willy Eddy, MD 01/09/16 [phone]

## 2016-04-01 ENCOUNTER — Emergency Department
Admission: EM | Admit: 2016-04-01 | Discharge: 2016-04-01 | Disposition: A | Discharge status: Home / Self Care | Payer: Self-pay | Attending: Emergency Medicine | Admitting: Emergency Medicine

## 2016-04-01 ENCOUNTER — Encounter: Payer: Self-pay | Admitting: *Deleted

## 2016-04-01 DIAGNOSIS — Z5321 Procedure and treatment not carried out due to patient leaving prior to being seen by health care provider: Secondary | ICD-10-CM | POA: Insufficient documentation

## 2016-04-01 DIAGNOSIS — R51 Headache: Secondary | ICD-10-CM | POA: Insufficient documentation

## 2016-04-01 DIAGNOSIS — Z79899 Other long term (current) drug therapy: Secondary | ICD-10-CM | POA: Insufficient documentation

## 2016-04-01 DIAGNOSIS — F17210 Nicotine dependence, cigarettes, uncomplicated: Secondary | ICD-10-CM | POA: Insufficient documentation

## 2016-04-01 NOTE — ED Notes (Signed)
Patient on the phone when this RN entered the room.  Patient states, "I'm at the hospital, can't someone else take her? All right."  Patient got off telephone and states that he has to leave to pick up his granddaughter.  This RN encouraged patient to stay and see physician.  Patient refused.  Patient states, "I have to hurry, but I'm coming back."  Patient not yet assessed by physician. Consulting civil engineerCharge RN notified.

## 2016-04-01 NOTE — ED Triage Notes (Addendum)
States headache and lightheaded since Saturday, states when he sitting he feels like the room is moving, denies any change in vision, denies any hx of migraines, awake and alert in no acute distress

## 2016-04-03 ENCOUNTER — Telehealth: Payer: Self-pay | Admitting: Emergency Medicine

## 2016-04-03 NOTE — Telephone Encounter (Signed)
Called patient due to lwot to inquire about condition and follow up plans. Says he has appt with pcp tomorrow.  He is feeling a little better, but still has the headache.

## 2019-03-14 ENCOUNTER — Other Ambulatory Visit: Payer: Self-pay

## 2019-03-14 ENCOUNTER — Ambulatory Visit (INDEPENDENT_AMBULATORY_CARE_PROVIDER_SITE_OTHER): Payer: Self-pay

## 2019-03-14 ENCOUNTER — Encounter: Payer: Self-pay | Admitting: Orthopedic Surgery

## 2019-03-14 ENCOUNTER — Ambulatory Visit: Payer: Self-pay | Admitting: Orthopedic Surgery

## 2019-03-14 VITALS — Ht 76.0 in | Wt 293.4 lb

## 2019-03-14 DIAGNOSIS — M25361 Other instability, right knee: Secondary | ICD-10-CM

## 2019-03-14 DIAGNOSIS — M25561 Pain in right knee: Secondary | ICD-10-CM

## 2019-03-14 DIAGNOSIS — M25562 Pain in left knee: Secondary | ICD-10-CM

## 2019-03-14 DIAGNOSIS — G8929 Other chronic pain: Secondary | ICD-10-CM

## 2019-03-14 DIAGNOSIS — M25461 Effusion, right knee: Secondary | ICD-10-CM

## 2019-03-18 ENCOUNTER — Encounter: Payer: Self-pay | Admitting: Orthopedic Surgery

## 2019-03-18 NOTE — Progress Notes (Signed)
Office Visit Note   Patient: Douglas Braun           Date of Birth: 1965/04/12           MRN: [id number] Visit Date: 03/14/2019 Requested by: No referring provider defined for this encounter. PCP: Patient, No Pcp Per  Subjective: Chief Complaint  Patient presents with  . Right Knee - Pain  . Left Knee - Pain    HPI: Douglas Braun is a 54 y.o. male who presents to the office complaining of right knee pain.  Patient complains of bilateral knee pain but right knee is significantly worse than the left.  He notes painful weightbearing that has been worse in the last 6 months.  He has had years of gradually worsening bilateral knee pain.  He denies any acute injury that he can recall 6 months ago.  He has constant pain that wakes him up at night.  Pain radiates down to his calf and he notes occasional instability.  He also notes mechanical symptoms with occasional locking of the knee in a flexed position.  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  He has no history of knee surgery.  He does have a history of meniscal tear in sixth grade.  He has been using a hinged knee brace with some relief.  He is also been trying meloxicam, Tylenol, ibuprofen, Ultram at different times without any significant long-lasting relief.  The anti-inflammatories did help with swelling but not with the pain..                ROS:  All systems reviewed are negative as they relate to the chief complaint within the history of present illness.  Patient denies fevers or chills.  Assessment & Plan: Visit Diagnoses:  1. Chronic pain of both knees   2. Right knee pain, unspecified chronicity   3. Effusion, right knee   4. Knee instability, right     Plan: Patient is a 54 year old male who presents complaining of primarily right knee pain.  He has had years of worsening knee pain but last 6 months have been particularly worse for the right knee.  He  has difficulty weightbearing and it is affecting his job as he cannot climb ladders as an Personnel officer.  He is also having instability and mechanical symptoms.  No ligamentous instability is felt on exam today but impression is a medial meniscal tear judging by his medial joint line tenderness and mechanical symptoms.  Radiographs taken today are negative for any fracture or dislocation or significant degenerative changes to explain his complaints.  Ordered MRI of the right knee to evaluate medial meniscal tear.  He will follow-up after MRI to review results.  Patient has had long history of symptoms and failure of conservative management lasting longer than 6 weeks.  MRI scan indicated at this time to rule out treatable arthroscopic pathology which can be addressed prior to damaging the articular surface of the knee further.  Follow-Up Instructions: No follow-ups on file.   Orders:  Orders Placed This Encounter  Procedures  . XR Knee 1-2 Views Right  . XR KNEE 3 VIEW LEFT  . MR Knee Right w/o contrast   No orders of the defined types were placed in this encounter.     Procedures: No procedures performed   Clinical Data: No additional findings.  Objective: Vital Signs: Ht 6\' 4"  (1.93 m)   Wt  293 lb 6.4 oz (133.1 kg)   BMI 35.71 kg/m   Physical Exam:  Constitutional: Patient appears well-developed HEENT:  Head: Normocephalic Eyes:EOM are normal Neck: Normal range of motion Cardiovascular: Normal rate Pulmonary/chest: Effort normal Neurologic: Patient is alert Skin: Skin is warm Psychiatric: Patient has normal mood and affect  Ortho Exam:  Right knee Exam Positive effusion Tenderness to palpation over the medial joint line Extensor mechanism intact No TTP over the lateral jointline, quad tendon, patellar tendon, pes anserinus, patella, tibial tubercle, LCL/MCL insertions  Stable to varus/valgus stresses.  Stable to anterior/posterior drawer.  No laxity with Lachman  exam Extension to 0 degrees Flexion > 90 degrees  Specialty Comments:  No specialty comments available.  Imaging: No results found.   PMFS History: Patient Active Problem List   Diagnosis Date Noted  . Chest pain with high risk for cardiac etiology 08/08/2014  . Tobacco use 08/08/2014   No past medical history on file.  Family History  Problem Relation Age of Onset  . Heart attack Mother   . Multiple sclerosis Father   . Heart attack Other     Past Surgical History:  Procedure Laterality Date  . FACIAL RECONSTRUCTION SURGERY    . Plastic Surgery of Right Ear     Social History   Occupational History  . Not on file  Tobacco Use  . Smoking status: Former Smoker    Packs/day: 1.00    Types: Cigarettes    Quit date: 03/13/2019    Years since quitting: 0.0  . Smokeless tobacco: Never Used  Substance and Sexual Activity  . Alcohol use: No  . Drug use: No  . Sexual activity: Yes

## 2019-04-02 ENCOUNTER — Ambulatory Visit
Admission: RE | Admit: 2019-04-02 | Discharge: 2019-04-02 | Disposition: A | Discharge status: Home / Self Care | Payer: Self-pay | Source: Ambulatory Visit | Attending: Orthopedic Surgery | Admitting: Orthopedic Surgery

## 2019-04-02 ENCOUNTER — Other Ambulatory Visit: Payer: Self-pay

## 2019-04-02 DIAGNOSIS — M25561 Pain in right knee: Secondary | ICD-10-CM

## 2019-04-05 ENCOUNTER — Telehealth: Payer: Self-pay | Admitting: Orthopedic Surgery

## 2019-04-05 NOTE — Telephone Encounter (Signed)
Please advise. Patient requesting MRI results.

## 2019-04-05 NOTE — Telephone Encounter (Signed)
Pt called in said he had his mri on [phone] and was wondering if dr.dean could call him with the results.    [phone]

## 2019-04-06 NOTE — Telephone Encounter (Signed)
I called lmom about scan

## 2019-04-13 ENCOUNTER — Other Ambulatory Visit: Payer: Self-pay

## 2019-04-13 ENCOUNTER — Encounter: Payer: Self-pay | Admitting: Orthopedic Surgery

## 2019-04-13 ENCOUNTER — Ambulatory Visit (INDEPENDENT_AMBULATORY_CARE_PROVIDER_SITE_OTHER): Payer: Self-pay | Admitting: Orthopedic Surgery

## 2019-04-13 DIAGNOSIS — S83241D Other tear of medial meniscus, current injury, right knee, subsequent encounter: Secondary | ICD-10-CM

## 2019-04-13 NOTE — Progress Notes (Signed)
Office Visit Note   Patient: Douglas Braun           Date of Birth: 01-23-1965           MRN: [id number] Visit Date: 04/13/2019 Requested by: No referring provider defined for this encounter. PCP: Patient, No Pcp Per  Subjective: Chief Complaint  Patient presents with  . Right Knee - Follow-up    HPI: Douglas Braun is a 54 year old patient with right knee pain.  Since have seen him he had an MRI scan.  That does show medial meniscal tear as well as chondral flap on that medial femoral condyle.  Continues to have pain and mechanical symptoms localizing primarily to the medial aspect of the knee.  He works as an Personnel officer.  No personal or family history of DVT or pulmonary embolism.              ROS: All systems reviewed are negative as they relate to the chief complaint within the history of present illness.  Patient denies  fevers or chills.   Assessment & Plan: Visit Diagnoses:  1. Acute medial meniscus tear of right knee, subsequent encounter     Plan: Impression is right knee medial meniscal tear with chondral flap.  Plan is meniscal debridement.  May need microfracture and chondral debridement also in that medial compartment.  The risk and benefits are discussed with the patient include not limited to infection nerve vessel damage incomplete pain relief persistent pain as well as potential need for more surgery.  All questions answered.  Anticipate out of work time at least 2 to 3 weeks.  Necessary rehabilitative process also discussed with the patient.  All questions answered  Follow-Up Instructions: No follow-ups on file.   Orders:  No orders of the defined types were placed in this encounter.  No orders of the defined types were placed in this encounter.     Procedures: No procedures performed   Clinical Data: No additional findings.  Objective: Vital Signs: There were no vitals taken for this visit.  Physical Exam:   Constitutional: Patient appears  well-developed HEENT:  Head: Normocephalic Eyes:EOM are normal Neck: Normal range of motion Cardiovascular: Normal rate Pulmonary/chest: Effort normal Neurologic: Patient is alert Skin: Skin is warm Psychiatric: Patient has normal mood and affect    Ortho Exam: Ortho exam demonstrates full active and passive range of motion of the right knee.  Effusion is present.  Medial joint line tenderness is present.  Collateral cruciate ligaments are stable.  Extensor mechanism is intact.  Specialty Comments:  No specialty comments available.  Imaging: No results found.   PMFS History: Patient Active Problem List   Diagnosis Date Noted  . Chest pain with high risk for cardiac etiology 08/08/2014  . Tobacco use 08/08/2014   History reviewed. No pertinent past medical history.  Family History  Problem Relation Age of Onset  . Heart attack Mother   . Multiple sclerosis Father   . Heart attack Other     Past Surgical History:  Procedure Laterality Date  . FACIAL RECONSTRUCTION SURGERY    . Plastic Surgery of Right Ear     Social History   Occupational History  . Not on file  Tobacco Use  . Smoking status: Former Smoker    Packs/day: 1.00    Types: Cigarettes    Quit date: 03/13/2019    Years since quitting: 0.0  . Smokeless tobacco: Never Used  Substance and Sexual Activity  . Alcohol use: No  .  Drug use: No  . Sexual activity: Yes

## 2019-05-02 ENCOUNTER — Encounter: Payer: Self-pay | Admitting: Surgical

## 2019-05-02 ENCOUNTER — Other Ambulatory Visit: Payer: Self-pay | Admitting: Surgical

## 2019-05-02 ENCOUNTER — Encounter: Payer: Self-pay | Admitting: Orthopedic Surgery

## 2019-05-02 DIAGNOSIS — S83231S Complex tear of medial meniscus, current injury, right knee, sequela: Secondary | ICD-10-CM

## 2019-05-02 MED ORDER — ASPIRIN EC 81 MG PO TBEC: 81.0000 mg | DELAYED_RELEASE_TABLET | Freq: Every day | ORAL | 0 refills | Status: AC

## 2019-05-02 MED ORDER — OXYCODONE HCL 5 MG PO TABS: 5.0000 mg | ORAL_TABLET | ORAL | 0 refills | Status: AC | PRN

## 2019-05-02 MED ORDER — METHOCARBAMOL 500 MG PO TABS: 500.0000 mg | ORAL_TABLET | Freq: Three times a day (TID) | ORAL | 0 refills | Status: DC | PRN

## 2019-05-09 ENCOUNTER — Ambulatory Visit (INDEPENDENT_AMBULATORY_CARE_PROVIDER_SITE_OTHER): Payer: Self-pay | Admitting: Surgical

## 2019-05-09 ENCOUNTER — Other Ambulatory Visit: Payer: Self-pay

## 2019-05-09 DIAGNOSIS — Z9889 Other specified postprocedural states: Secondary | ICD-10-CM

## 2019-05-15 ENCOUNTER — Encounter: Payer: Self-pay | Admitting: Surgical

## 2019-05-15 NOTE — Progress Notes (Signed)
   Post-Op Visit Note   Patient: Douglas Braun           Date of Birth: 1965-05-19           MRN: [id number] Visit Date: 05/09/2019 PCP: Patient, No Pcp Per   Assessment & Plan:  Chief Complaint:  Chief Complaint  Patient presents with  . Right Knee - Routine Post Op   Visit Diagnoses:  1. Status post arthroscopy of right knee     Plan: Patient is a 54 year old male who presents s/p right knee arthroscopy with medial meniscectomy on 05/02/2019.  Patient is doing very well aside from some soreness.  He is walking with a cane.  Incisions are healing well and sutures removed today and replaced with Steri-Strips.  He does have an effusion but declined aspiration today.  Good range of motion with 0 degrees of extension and 95 to 100 degrees of flexion.  Plan for patient to continue working on his range of motion and quadricep strengthening.  Follow-up in 3 weeks for clinical recheck.  Patient agrees with plan.  Follow-Up Instructions: No follow-ups on file.   Orders:  No orders of the defined types were placed in this encounter.  No orders of the defined types were placed in this encounter.   Imaging: No results found.  PMFS History: Patient Active Problem List   Diagnosis Date Noted  . Chest pain with high risk for cardiac etiology 08/08/2014  . Tobacco use 08/08/2014   No past medical history on file.  Family History  Problem Relation Age of Onset  . Heart attack Mother   . Multiple sclerosis Father   . Heart attack Other     Past Surgical History:  Procedure Laterality Date  . FACIAL RECONSTRUCTION SURGERY    . Plastic Surgery of Right Ear     Social History   Occupational History  . Not on file  Tobacco Use  . Smoking status: Former Smoker    Packs/day: 1.00    Types: Cigarettes    Quit date: 03/13/2019    Years since quitting: 0.1  . Smokeless tobacco: Never Used  Substance and Sexual Activity  . Alcohol use: No  . Drug use: No  . Sexual activity: Yes

## 2019-05-30 ENCOUNTER — Ambulatory Visit (INDEPENDENT_AMBULATORY_CARE_PROVIDER_SITE_OTHER): Payer: Self-pay | Admitting: Orthopedic Surgery

## 2019-05-30 ENCOUNTER — Other Ambulatory Visit: Payer: Self-pay

## 2019-05-30 DIAGNOSIS — Z9889 Other specified postprocedural states: Secondary | ICD-10-CM

## 2019-06-05 ENCOUNTER — Encounter: Payer: Self-pay | Admitting: Orthopedic Surgery

## 2019-06-05 NOTE — Progress Notes (Signed)
   Post-Op Visit Note   Patient: Douglas Braun           Date of Birth: 12-04-1965           MRN: [id number] Visit Date: 05/30/2019 PCP: Patient, No Pcp Per   Assessment & Plan:  Chief Complaint:  Chief Complaint  Patient presents with  . Right Knee - Follow-up    05/02/2019 Right knee arthroscopy, MM   Visit Diagnoses:  1. Status post arthroscopy of right knee     Plan: Douglas Braun is now about a month out right knee arthroscopy partial medial meniscectomy.  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  He has been doing walking and stationary bike his exercise.  States he is settling better than he was before surgery.  He is going to use a knee sleeve in the future.  Continue with nonweightbearing quad strengthening exercises.  Negative Homans no calf tenderness today.  Range of motion excellent.  Trace effusion.  Quad strength improving.  Follow-up as needed.  Follow-Up Instructions: No follow-ups on file.   Orders:  No orders of the defined types were placed in this encounter.  No orders of the defined types were placed in this encounter.   Imaging: No results found.  PMFS History: Patient Active Problem List   Diagnosis Date Noted  . Chest pain with high risk for cardiac etiology 08/08/2014  . Tobacco use 08/08/2014   No past medical history on file.  Family History  Problem Relation Age of Onset  . Heart attack Mother   . Multiple sclerosis Father   . Heart attack Other     Past Surgical History:  Procedure Laterality Date  . FACIAL RECONSTRUCTION SURGERY    . Plastic Surgery of Right Ear     Social History   Occupational History  . Not on file  Tobacco Use  . Smoking status: Former Smoker    Packs/day: 1.00    Types: Cigarettes    Quit date: 03/13/2019    Years since quitting: 0.2  . Smokeless tobacco: Never Used  Substance and Sexual Activity  . Alcohol use: No  . Drug use: No  . Sexual activity: Yes

## 2020-08-30 ENCOUNTER — Emergency Department
Admission: EM | Admit: 2020-08-30 | Discharge: 2020-08-30 | Disposition: A | Discharge status: Home / Self Care | Payer: Self-pay | Attending: Emergency Medicine | Admitting: Emergency Medicine

## 2020-08-30 ENCOUNTER — Other Ambulatory Visit: Payer: Self-pay

## 2020-08-30 ENCOUNTER — Encounter: Payer: Self-pay | Admitting: Emergency Medicine

## 2020-08-30 ENCOUNTER — Emergency Department: Payer: Self-pay

## 2020-08-30 DIAGNOSIS — Z20822 Contact with and (suspected) exposure to covid-19: Secondary | ICD-10-CM | POA: Insufficient documentation

## 2020-08-30 DIAGNOSIS — F17210 Nicotine dependence, cigarettes, uncomplicated: Secondary | ICD-10-CM | POA: Insufficient documentation

## 2020-08-30 DIAGNOSIS — M791 Myalgia, unspecified site: Secondary | ICD-10-CM | POA: Insufficient documentation

## 2020-08-30 DIAGNOSIS — J189 Pneumonia, unspecified organism: Secondary | ICD-10-CM | POA: Insufficient documentation

## 2020-08-30 DIAGNOSIS — R519 Headache, unspecified: Secondary | ICD-10-CM | POA: Insufficient documentation

## 2020-08-30 DIAGNOSIS — I10 Essential (primary) hypertension: Secondary | ICD-10-CM | POA: Insufficient documentation

## 2020-08-30 HISTORY — DX: Polyneuropathy, unspecified: G62.9

## 2020-08-30 LAB — CBC WITH DIFFERENTIAL/PLATELET
Abs Immature Granulocytes: 0.05 10*3/uL (ref 0.00–0.07)
Basophils Absolute: 0.1 10*3/uL (ref 0.0–0.1)
Basophils Relative: 0 %
Eosinophils Absolute: 0 10*3/uL (ref 0.0–0.5)
Eosinophils Relative: 0 %
HCT: 47.9 % (ref 39.0–52.0)
Hemoglobin: 17.3 g/dL — ABNORMAL HIGH (ref 13.0–17.0)
Immature Granulocytes: 0 %
Lymphocytes Relative: 8 %
Lymphs Abs: 0.9 10*3/uL (ref 0.7–4.0)
MCH: 31.6 pg (ref 26.0–34.0)
MCHC: 36.1 g/dL — ABNORMAL HIGH (ref 30.0–36.0)
MCV: 87.4 fL (ref 80.0–100.0)
Monocytes Absolute: 0.9 10*3/uL (ref 0.1–1.0)
Monocytes Relative: 8 %
Neutro Abs: 9.3 10*3/uL — ABNORMAL HIGH (ref 1.7–7.7)
Neutrophils Relative %: 84 %
Platelets: 147 10*3/uL — ABNORMAL LOW (ref 150–400)
RBC: 5.48 MIL/uL (ref 4.22–5.81)
RDW: 12.9 % (ref 11.5–15.5)
WBC: 11.2 10*3/uL — ABNORMAL HIGH (ref 4.0–10.5)
nRBC: 0 % (ref 0.0–0.2)

## 2020-08-30 LAB — RESP PANEL BY RT-PCR (FLU A&B, COVID) ARPGX2
Influenza A by PCR: NEGATIVE
Influenza B by PCR: NEGATIVE
SARS Coronavirus 2 by RT PCR: NEGATIVE

## 2020-08-30 LAB — COMPREHENSIVE METABOLIC PANEL
ALT: 58 U/L — ABNORMAL HIGH (ref 0–44)
AST: 53 U/L — ABNORMAL HIGH (ref 15–41)
Albumin: 3.9 g/dL (ref 3.5–5.0)
Alkaline Phosphatase: 96 U/L (ref 38–126)
Anion gap: 9 (ref 5–15)
BUN: 10 mg/dL (ref 6–20)
CO2: 21 mmol/L — ABNORMAL LOW (ref 22–32)
Calcium: 9 mg/dL (ref 8.9–10.3)
Chloride: 96 mmol/L — ABNORMAL LOW (ref 98–111)
Creatinine, Ser: 0.83 mg/dL (ref 0.61–1.24)
GFR, Estimated: >60 mL/min (ref >60)
Glucose, Bld: 132 mg/dL — ABNORMAL HIGH (ref 70–99)
Potassium: 3.9 mmol/L (ref 3.5–5.1)
Sodium: 126 mmol/L — ABNORMAL LOW (ref 135–145)
Total Bilirubin: 0.9 mg/dL (ref 0.3–1.2)
Total Protein: 7.6 g/dL (ref 6.5–8.1)

## 2020-08-30 LAB — LACTIC ACID, PLASMA: Lactic Acid, Venous: 1.5 mmol/L (ref 0.5–1.9)

## 2020-08-30 LAB — POC SARS CORONAVIRUS 2 AG -  ED: SARSCOV2ONAVIRUS 2 AG: NEGATIVE

## 2020-08-30 MED ORDER — KETOROLAC TROMETHAMINE 30 MG/ML IJ SOLN
INTRAMUSCULAR | Status: AC
  Administered 2020-08-30: 30 mg via INTRAVENOUS
  Filled 2020-08-30: qty 1

## 2020-08-30 MED ORDER — LEVOFLOXACIN 750 MG PO TABS
750.0000 mg | ORAL_TABLET | Freq: Once | ORAL | Status: AC
  Administered 2020-08-30: 750 mg via ORAL
  Filled 2020-08-30: qty 1

## 2020-08-30 MED ORDER — SODIUM CHLORIDE 0.9 % IV BOLUS
500.0000 mL | Freq: Once | INTRAVENOUS | Status: AC
  Administered 2020-08-30: 500 mL via INTRAVENOUS

## 2020-08-30 MED ORDER — KETOROLAC TROMETHAMINE 30 MG/ML IJ SOLN: 30.0000 mg | Freq: Once | INTRAMUSCULAR | Status: AC

## 2020-08-30 MED ORDER — LEVOFLOXACIN 750 MG PO TABS: 750.0000 mg | ORAL_TABLET | Freq: Every day | ORAL | 0 refills | Status: AC

## 2020-08-30 NOTE — ED Notes (Signed)
Ice water provided per request.

## 2020-08-30 NOTE — ED Triage Notes (Addendum)
Pt via POV from home. Pt c/o headache, fever, chills, and nausea. Pt states all this started on Tues. Highest he seen his fever was 103.0. Denies exposure to COVID. Pt took 2 at home COVID test which both were negative. Pt is A&Ox4 and NAD.

## 2020-08-30 NOTE — ED Notes (Signed)
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 EDP saw pt at bedside. Waiting on discharge paperwork.

## 2020-08-30 NOTE — ED Notes (Signed)
Pt called nurse in because began sweating profusely. VS within range except RR which is 25s-35. Temp rechecked, 98.9 oral. 98.2 axillary. Ice water and cold wet rag provided.

## 2020-08-30 NOTE — ED Notes (Signed)
Rainbow and 1 set of blood cultures sent to lab at this time. ?

## 2020-08-30 NOTE — ED Notes (Signed)
Pt is curent smoker 1 pack/day.  For 3d has had fever, chills and 10/10 HA. Denies covid exposure and had 2 home tests this wk that were negative. Denies cough, congestion but does seem to be coughing, pt states this is his normal "smoker's cough".  Temp was 103 oral yesterday. Today has not taken antipyretics and temp in triage was 100.5. Took aspirin this morning around 0400.  Eyes are sensitive to light.

## 2020-08-30 NOTE — ED Provider Notes (Signed)
[facility] Emergency Department Provider Note   ____________________________________________    I have reviewed the triage vital signs and the nursing notes.   HISTORY  Chief Complaint Headache, Chills, and Fever     HPI Douglas Braun is a 55 y.o. male with history as below who presents with complaints of headache, fever, chills, myalgias, fatigue been ongoing for 2 to 3 days now.  He reports he took 2 COVID test at home which were negative.  No nausea vomiting or abdominal pain.  No shortness of breath, reports occasional cough.  Past Medical History:  Diagnosis Date   Peripheral neuropathy     Patient Active Problem List   Diagnosis Date Noted   Chest pain with high risk for cardiac etiology 08/08/2014   Tobacco use 08/08/2014    Past Surgical History:  Procedure Laterality Date   FACIAL RECONSTRUCTION SURGERY     Plastic Surgery of Right Ear      Prior to Admission medications   Medication Sig Start Date End Date Taking? Authorizing Provider  levofloxacin (LEVAQUIN) 750 MG tablet Take 1 tablet (750 mg total) by mouth daily for 7 days. 08/30/20 09/06/20 Yes Jene Every, MD  albuterol (PROVENTIL HFA;VENTOLIN HFA) 108 (90 Base) MCG/ACT inhaler Inhale 2 puffs into the lungs every 6 (six) hours as needed for wheezing or shortness of breath. 01/09/16   Willy Eddy, MD  cyclobenzaprine (FLEXERIL) 10 MG tablet Take 1 tablet (10 mg total) by mouth 3 (three) times daily as needed for muscle spasms. 01/09/16   Willy Eddy, MD  methocarbamol (ROBAXIN) 500 MG tablet Take 1 tablet (500 mg total) by mouth every 8 (eight) hours as needed. 05/02/19   Magnant, Joycie Peek, PA-C     Allergies Morphine and related and Penicillins  Family History  Problem Relation Age of Onset   Heart attack Mother    Multiple sclerosis Father    Heart attack Other     Social History Social History   Tobacco Use   Smoking status: Every Day    Packs/day:  1.00    Types: Cigarettes    Last attempt to quit: 03/13/2019    Years since quitting: 1.4   Smokeless tobacco: Never  Substance Use Topics   Alcohol use: No   Drug use: No    Review of Systems  Constitutional: Fever Eyes: No visual changes.  ENT: Congestion Cardiovascular: Denies chest pain. Respiratory: As above Gastrointestinal: No abdominal pain.  No nausea, no vomiting.   Genitourinary: Negative for dysuria. Musculoskeletal: Myalgias Skin: Negative for rash. Neurological: Headache   ____________________________________________   PHYSICAL EXAM:  VITAL SIGNS: ED Triage Vitals  Enc Vitals Group     BP 08/30/20 0859 (!) 137/94     Pulse Rate 08/30/20 0859 (!) 117     Resp 08/30/20 0859 18     Temp 08/30/20 0859 (!) 100.5 F (38.1 C)     Temp Source 08/30/20 0859 Oral     SpO2 08/30/20 0859 96 %     Weight 08/30/20 0901 129.3 kg (285 lb)     Height 08/30/20 0901 1.93 m (6\' 4" )     Head Circumference --      Peak Flow --      Pain Score 08/30/20 0900 9     Pain Loc --      Pain Edu? --      Excl. in GC? --     Constitutional: Alert and oriented. Eyes: Conjunctivae are normal.  Nose: No congestion/rhinnorhea. Mouth/Throat: Mucous membranes are moist.    Cardiovascular: Tachycardia, regular rhythm.  Good peripheral circulation. Respiratory: Normal respiratory effort.  No retractions. Lungs CTAB. Gastrointestinal: Soft and nontender. No distention.    Musculoskeletal: No lower extremity tenderness nor edema.  Warm and well perfused Neurologic:  Normal speech and language. No gross focal neurologic deficits are appreciated.  Skin:  Skin is warm, dry and intact. No rash noted. Psychiatric: Mood and affect are normal. Speech and behavior are normal.  ____________________________________________   LABS (all labs ordered are listed, but only abnormal results are displayed)  Labs Reviewed  COMPREHENSIVE METABOLIC PANEL - Abnormal; Notable for the following  components:      Result Value   Sodium 126 (*)    Chloride 96 (*)    CO2 21 (*)    Glucose, Bld 132 (*)    AST 53 (*)    ALT 58 (*)    All other components within normal limits  CBC WITH DIFFERENTIAL/PLATELET - Abnormal; Notable for the following components:   WBC 11.2 (*)    Hemoglobin 17.3 (*)    MCHC 36.1 (*)    Platelets 147 (*)    Neutro Abs 9.3 (*)    All other components within normal limits  RESP PANEL BY RT-PCR (FLU A&B, COVID) ARPGX2  LACTIC ACID, PLASMA  LACTIC ACID, PLASMA  URINALYSIS, COMPLETE (UACMP) WITH MICROSCOPIC  POC SARS CORONAVIRUS 2 AG -  ED   ____________________________________________  EKG  ED ECG REPORT I, Jene Every, the attending physician, personally viewed and interpreted this ECG.  Date: 08/30/2020  Rhythm: Sinus tachycardia QRS Axis: normal Intervals: normal ST/T Wave abnormalities: normal Narrative Interpretation: no evidence of acute ischemia  ____________________________________________  RADIOLOGY  Chest x-ray viewed by me, atelectasis versus infiltrate ____________________________________________   PROCEDURES  Procedure(s) performed: No  Procedures   Critical Care performed: No ____________________________________________   INITIAL IMPRESSION / ASSESSMENT AND PLAN / ED COURSE  Pertinent labs & imaging results that were available during my care of the patient were reviewed by me and considered in my medical decision making (see chart for details).   Patient presents with tachycardia, fever, body aches, viral symptoms suspicious for COVID-19.  Influenza and pneumonia are also on the differential  Chest x-ray demonstrates likely atelectasis less likely  Lactic acid is normal, very mild elevation of white blood cell count.  Hyponatremia noted  Rapid COVID pending  Will treat with IV Toradol, IV fluids  Patient's vital signs improved after fluids.  Rapid COVID is negative.  Suspicious for pneumonia given chest  x-ray, fever.  Recommended admission to the patient however he declined stating he would like to try oral antibiotics first because he has dogs and animals at home that he needs to care for.  He states he will come back immediately if any worsening of his symptoms.  He does have decisional capacity    ____________________________________________   FINAL CLINICAL IMPRESSION(S) / ED DIAGNOSES  Final diagnoses:  Community acquired pneumonia, unspecified laterality        Note:  This document was prepared using Conservation officer, historic buildings and may include unintentional dictation errors.    Jene Every, MD 08/30/20 [phone]

## 2020-08-30 NOTE — ED Notes (Signed)
Pt taken to Xray.

## 2020-12-06 ENCOUNTER — Emergency Department
Admission: EM | Admit: 2020-12-06 | Discharge: 2020-12-06 | Disposition: A | Discharge status: Home / Self Care | Payer: Self-pay | Attending: Emergency Medicine | Admitting: Emergency Medicine

## 2020-12-06 ENCOUNTER — Encounter: Payer: Self-pay | Admitting: Emergency Medicine

## 2020-12-06 ENCOUNTER — Other Ambulatory Visit: Payer: Self-pay

## 2020-12-06 DIAGNOSIS — F17210 Nicotine dependence, cigarettes, uncomplicated: Secondary | ICD-10-CM | POA: Insufficient documentation

## 2020-12-06 DIAGNOSIS — W260XXA Contact with knife, initial encounter: Secondary | ICD-10-CM | POA: Insufficient documentation

## 2020-12-06 DIAGNOSIS — S61011A Laceration without foreign body of right thumb without damage to nail, initial encounter: Secondary | ICD-10-CM | POA: Insufficient documentation

## 2020-12-06 MED ORDER — LIDOCAINE HCL (PF) 1 % IJ SOLN
5.0000 mL | Freq: Once | INTRAMUSCULAR | Status: DC
  Filled 2020-12-06: qty 5

## 2020-12-06 NOTE — ED Provider Notes (Signed)
[facility] Emergency Department Provider Note ____________________________________________  Time seen: 1415  I have reviewed the triage vital signs and the nursing notes.  HISTORY  Chief Complaint  Laceration   HPI Douglas Braun is a 55 y.o. male presents to the ED for evaluation of accidental laceration to right thumb.  Patient was using a razor knife to cut some sheet metal, when he excellently sliced the tip of his right thumb.  He presents with a large flap to the distal tip of the thumb without nail injury or avulsion.  He reports a current tetanus status.  Past Medical History:  Diagnosis Date   Peripheral neuropathy     Patient Active Problem List   Diagnosis Date Noted   Chest pain with high risk for cardiac etiology 08/08/2014   Tobacco use 08/08/2014    Past Surgical History:  Procedure Laterality Date   FACIAL RECONSTRUCTION SURGERY     Plastic Surgery of Right Ear      Prior to Admission medications   Medication Sig Start Date End Date Taking? Authorizing Provider  albuterol (PROVENTIL HFA;VENTOLIN HFA) 108 (90 Base) MCG/ACT inhaler Inhale 2 puffs into the lungs every 6 (six) hours as needed for wheezing or shortness of breath. 01/09/16   Willy Eddy, MD  cyclobenzaprine (FLEXERIL) 10 MG tablet Take 1 tablet (10 mg total) by mouth 3 (three) times daily as needed for muscle spasms. 01/09/16   Willy Eddy, MD  methocarbamol (ROBAXIN) 500 MG tablet Take 1 tablet (500 mg total) by mouth every 8 (eight) hours as needed. 05/02/19   Magnant, Joycie Peek, PA-C    Allergies Morphine and related and Penicillins  Family History  Problem Relation Age of Onset   Heart attack Mother    Multiple sclerosis Father    Heart attack Other     Social History Social History   Tobacco Use   Smoking status: Every Day    Packs/day: 1.00    Types: Cigarettes    Last attempt to quit: 03/13/2019    Years since quitting: 1.7   Smokeless tobacco:  Never  Substance Use Topics   Alcohol use: No   Drug use: No    Review of Systems  Constitutional: Negative for fever. Eyes: Negative for visual changes. ENT: Negative for sore throat. Cardiovascular: Negative for chest pain. Respiratory: Negative for shortness of breath. Gastrointestinal: Negative for abdominal pain, vomiting and diarrhea. Genitourinary: Negative for dysuria. Musculoskeletal: Negative for back pain. Skin: Negative for rash.  Right thumb laceration as above Neurological: Negative for headaches, focal weakness or numbness. ____________________________________________  PHYSICAL EXAM:  VITAL SIGNS: ED Triage Vitals  Enc Vitals Group     BP 12/06/20 1401 (!) 159/101     Pulse Rate 12/06/20 1401 85     Resp 12/06/20 1401 18     Temp 12/06/20 1401 97.7 F (36.5 C)     Temp Source 12/06/20 1401 Oral     SpO2 12/06/20 1401 95 %     Weight 12/06/20 1358 285 lb 0.9 oz (129.3 kg)     Height 12/06/20 1358 6\' 4"  (1.93 m)     Head Circumference --      Peak Flow --      Pain Score 12/06/20 1357 1     Pain Loc --      Pain Edu? --      Excl. in [place]? --     Constitutional: Alert and oriented. Well appearing and in no distress. Head: Normocephalic  and atraumatic. Eyes: Conjunctivae are normal. Normal extraocular movements Cardiovascular: Normal rate, regular rhythm. Normal distal pulses. Respiratory: Normal respiratory effort. No wheezes/rales/rhonchi. Gastrointestinal: Soft and nontender. No distention. Musculoskeletal: Nontender with normal range of motion in all extremities.  Normal composite fist on the right.  Patient with a distal fingertip laceration nearly circumferential to the fat pad.  No nail bed injury or avulsion is appreciated.   Neurologic:  Normal gait without ataxia. Normal speech and language. No gross focal neurologic deficits are appreciated. Skin:  Skin is warm, dry and intact. No rash noted. Psychiatric: Mood and affect are normal. Patient  exhibits appropriate insight and judgment. ____________________________________________    {LABS (pertinent positives/negatives)  ____________________________________________  {EKG  ____________________________________________   RADIOLOGY Official radiology report(s): No results found. ____________________________________________  PROCEDURES  .Marland KitchenLaceration Repair  Date/Time: 12/06/2020 2:30 PM Performed by: Lissa Hoard, PA-C Authorized by: Lissa Hoard, PA-C   Consent:    Consent obtained:  Verbal   Consent given by:  Patient   Risks, benefits, and alternatives were discussed: yes     Risks discussed:  Poor wound healing Universal protocol:    Site/side marked: yes     Patient identity confirmed:  Verbally with patient Anesthesia:    Anesthesia method:  Nerve block   Block anesthetic:  Lidocaine 1% w/o epi   Block technique:  Transthecal block   Block outcome:  Anesthesia achieved Laceration details:    Location:  Finger   Finger location:  R thumb   Length (cm):  1.5   Depth (mm):  5 Exploration:    Limited defect created (wound extended): no     Hemostasis achieved with:  Direct pressure   Contaminated: no   Treatment:    Area cleansed with:  Saline   Amount of cleaning:  Standard   Irrigation solution:  Sterile saline   Irrigation volume:  10   Irrigation method:  Tap   Debridement:  None   Undermining:  None   Scar revision: no   Skin repair:    Repair method:  Sutures   Suture size:  5-0   Suture material:  Nylon   Suture technique:  Simple interrupted   Number of sutures:  4 Approximation:    Approximation:  Close Repair type:    Repair type:  Simple Post-procedure details:    Dressing:  Bulky dressing   Procedure completion:  Tolerated well, no immediate complications ____________________________________________   INITIAL IMPRESSION / ASSESSMENT AND PLAN / ED COURSE  As part of my medical decision making, I reviewed  the following data within the electronic MEDICAL RECORD NUMBER Notes from prior ED visits   Patient ED evaluation and suture repair of a accidental Laceration to the tip of the right thumb.  Patient presents in no acute distress with a current tetanus status.  He consents to suture repair which was performed with good wound edge approximation.  Patient's wound is dressed and he is discharged with wound care instructions.  Follow-up with primary provider for suture removal in 7 to 10 days.  Douglas Braun was evaluated in Emergency Department on 12/06/2020 for the symptoms described in the history of present illness. He was evaluated in the context of the global COVID-19 pandemic, which necessitated consideration that the patient might be at risk for infection with the SARS-CoV-2 virus that causes COVID-19. Institutional protocols and algorithms that pertain to the evaluation of patients at risk for COVID-19 are in a state of rapid  change based on information released by regulatory bodies including the CDC and federal and state organizations. These policies and algorithms were followed during the patient's care in the ED. ____________________________________________  FINAL CLINICAL IMPRESSION(S) / ED DIAGNOSES  Final diagnoses:  Laceration of right thumb without foreign body without damage to nail, initial encounter      Lissa Hoard, PA-C 12/06/20 1639    Shaune Pollack, MD 12/06/20 2208

## 2020-12-06 NOTE — ED Triage Notes (Signed)
Pt comes into the ED via POV c/o right thumb laceration.  Pt currently has the bleeding under control and has the laceration taped with duct tape.  Pt states he cut his thumb with a razor blade at work.  Pt in NAD.

## 2020-12-06 NOTE — Discharge Instructions (Signed)
Keep the wound clean, dry, and covered. See your provider in 7-10 days for suture removal.

## 2022-03-18 ENCOUNTER — Emergency Department
Admission: EM | Admit: 2022-03-18 | Discharge: 2022-03-18 | Disposition: A | Discharge status: Home / Self Care | Payer: Self-pay | Attending: Emergency Medicine | Admitting: Emergency Medicine

## 2022-03-18 ENCOUNTER — Other Ambulatory Visit: Payer: Self-pay

## 2022-03-18 ENCOUNTER — Emergency Department: Payer: Self-pay

## 2022-03-18 DIAGNOSIS — M7752 Other enthesopathy of left foot: Secondary | ICD-10-CM

## 2022-03-18 DIAGNOSIS — M65272 Calcific tendinitis, left ankle and foot: Secondary | ICD-10-CM | POA: Insufficient documentation

## 2022-03-18 MED ORDER — KETOROLAC TROMETHAMINE 30 MG/ML IJ SOLN
30.0000 mg | Freq: Once | INTRAMUSCULAR | Status: AC
  Administered 2022-03-18: 30 mg via INTRAMUSCULAR
  Filled 2022-03-18: qty 1

## 2022-03-18 MED ORDER — MELOXICAM 15 MG PO TABS: 15.0000 mg | ORAL_TABLET | Freq: Every day | ORAL | 0 refills | Status: AC

## 2022-03-18 NOTE — ED Triage Notes (Signed)
Pt presents to the ED due to L foot pain. Pt states he has pain in his feet chronically but today its worse. Pt states has no prior hx of gout or diabetes. Pt states is becoming diffculty to bear weight on the foot. Pt denies any trauma to the foot. Pt NAD. Pt A&Ox4

## 2022-03-18 NOTE — ED Provider Notes (Signed)
[facility] Provider Note  Patient Contact: 9:27 PM (approximate)   History   Joint Swelling   HPI  Douglas Braun is a 57 y.o. male who presents the emergency department for evaluation of his left ankle.  Patient states that he is on his feet all day long with his job.  He does have a history of peripheral neuropathy.  Today when he got home he had some swelling along the medial ankle joint.  This resolved after putting his feet up and resting his feet.  Patient has no ongoing edema but still has some pain along the medial malleolus region.  No injury.  No medication prior to arrival.     Physical Exam   Triage Vital Signs: ED Triage Vitals  Enc Vitals Group     BP 03/18/22 1747 121/83     Pulse Rate 03/18/22 1747 86     Resp 03/18/22 1747 18     Temp 03/18/22 1747 97.6 F (36.4 C)     Temp Source 03/18/22 1747 Oral     SpO2 03/18/22 1747 95 %     Weight --      Height --      Head Circumference --      Peak Flow --      Pain Score 03/18/22 1745 7     Pain Loc --      Pain Edu? --      Excl. in [place]? --     Most recent vital signs: Vitals:   03/18/22 1747  BP: 121/83  Pulse: 86  Resp: 18  Temp: 97.6 F (36.4 C)  SpO2: 95%     General: Alert and in no acute distress.  Cardiovascular:  Good peripheral perfusion Respiratory: Normal respiratory effort without tachypnea or retractions. Lungs CTAB.  Musculoskeletal: Full range of motion to all extremities.  Visualization of the left ankle reveals no appreciable edema when compared with right.  Patient has no warmth to the joint.  No erythema.  There is tenderness along the tibiotalar joint line.  No palpable abnormality.  Pulses, sensation intact to the foot. Neurologic:  No gross focal neurologic deficits are appreciated.  Skin:   No rash noted Other:   ED Results / Procedures / Treatments   Labs (all labs ordered are listed, but only abnormal results are displayed) Labs Reviewed -  No data to display   EKG     RADIOLOGY  I personally viewed, evaluated, and interpreted these images as part of my medical decision making, as well as reviewing the written report by the radiologist.  ED Provider Interpretation: No acute findings on x-ray of the foot or ankle  DG Ankle Complete Left  Result Date: 03/18/2022 CLINICAL DATA:  Left ankle pain. More spine today than usual after work. EXAM: LEFT ANKLE COMPLETE - 3+ VIEW; LEFT FOOT - COMPLETE 3+ VIEW COMPARISON:  Left tibia and fibula radiographs 08/01/2010. FINDINGS: Left ankle: Ankle mortise is symmetric and intact. Mild medial and lateral malleolus and adjacent lateral talar process degenerative spurring. Mild chronic enthesopathic change at the Achilles insertion on the calcaneus, similar to prior. Minimal dorsal talonavicular and navicular-cuneiform degenerative spurring. Joint spaces are maintained. No acute fracture or dislocation. Left foot: Minimal lateral great toe metatarsophalangeal degenerative spurring. Joint spaces are preserved. No acute fracture or dislocation. IMPRESSION: 1. Mild ankle osteoarthritis. 2. Minimal great toe metatarsophalangeal osteoarthritis. 3. No acute fracture. Electronically Signed   By: Yvonne Kendall M.D.   On:  03/18/2022 18:45   DG Foot Complete Left  Result Date: 03/18/2022 CLINICAL DATA:  Left ankle pain. More spine today than usual after work. EXAM: LEFT ANKLE COMPLETE - 3+ VIEW; LEFT FOOT - COMPLETE 3+ VIEW COMPARISON:  Left tibia and fibula radiographs 08/01/2010. FINDINGS: Left ankle: Ankle mortise is symmetric and intact. Mild medial and lateral malleolus and adjacent lateral talar process degenerative spurring. Mild chronic enthesopathic change at the Achilles insertion on the calcaneus, similar to prior. Minimal dorsal talonavicular and navicular-cuneiform degenerative spurring. Joint spaces are maintained. No acute fracture or dislocation. Left foot: Minimal lateral great toe  metatarsophalangeal degenerative spurring. Joint spaces are preserved. No acute fracture or dislocation. IMPRESSION: 1. Mild ankle osteoarthritis. 2. Minimal great toe metatarsophalangeal osteoarthritis. 3. No acute fracture. Electronically Signed   By: Yvonne Kendall M.D.   On: 03/18/2022 18:45    PROCEDURES:  Critical Care performed: No  Procedures   MEDICATIONS ORDERED IN ED: Medications  ketorolac (TORADOL) 30 MG/ML injection 30 mg (has no administration in time range)     IMPRESSION / MDM / ASSESSMENT AND PLAN / ED COURSE  I reviewed the triage vital signs and the nursing notes.                                 Differential diagnosis includes, but is not limited to, arthritis, gout, septic arthritis, tendinitis, fracture, dislocation  Patient's presentation is most consistent with acute presentation with potential threat to life or bodily function.   Patient's diagnosis is consistent with tendinitis of the ankle.  Patient presents emergency department pain, swelling along the medial joint of the left ankle.  This is largely resolved after being off of his foot.  Slightly tender along the tibial talus joint line.  No palpable abnormality.  X-rays reassuring.  Given symptoms, physical exam findings suspect tendinitis of the ankle.  No indication for further workup.  Anti-inflammatory prescribed for the patient at this time..  Patient is given ED precautions to return to the ED for any worsening or new symptoms.     FINAL CLINICAL IMPRESSION(S) / ED DIAGNOSES   Final diagnoses:  Tendinitis of left ankle     Rx / DC Orders   ED Discharge Orders          Ordered    meloxicam (MOBIC) 15 MG tablet  Daily        03/18/22 2131             Note:  This document was prepared using Dragon voice recognition software and may include unintentional dictation errors.   Brynda Peon 03/18/22 2132    Duffy Bruce, MD 03/20/22 [phone]

## 2022-12-23 ENCOUNTER — Emergency Department: Payer: Self-pay

## 2022-12-23 ENCOUNTER — Emergency Department
Admission: EM | Admit: 2022-12-23 | Discharge: 2022-12-23 | Disposition: A | Discharge status: Home / Self Care | Payer: Self-pay | Attending: Emergency Medicine | Admitting: Emergency Medicine

## 2022-12-23 ENCOUNTER — Other Ambulatory Visit: Payer: Self-pay

## 2022-12-23 DIAGNOSIS — R079 Chest pain, unspecified: Secondary | ICD-10-CM | POA: Insufficient documentation

## 2022-12-23 LAB — BASIC METABOLIC PANEL
Anion gap: 9 (ref 5–15)
BUN: 19 mg/dL (ref 6–20)
CO2: 24 mmol/L (ref 22–32)
Calcium: 9.3 mg/dL (ref 8.9–10.3)
Chloride: 103 mmol/L (ref 98–111)
Creatinine, Ser: 0.88 mg/dL (ref 0.61–1.24)
GFR, Estimated: >60 mL/min (ref >60)
Glucose, Bld: 149 mg/dL — ABNORMAL HIGH (ref 70–99)
Potassium: 3.6 mmol/L (ref 3.5–5.1)
Sodium: 136 mmol/L (ref 135–145)

## 2022-12-23 LAB — CBC
HCT: 46.4 % (ref 39.0–52.0)
Hemoglobin: 16.1 g/dL (ref 13.0–17.0)
MCH: 30.8 pg (ref 26.0–34.0)
MCHC: 34.7 g/dL (ref 30.0–36.0)
MCV: 88.9 fL (ref 80.0–100.0)
Platelets: 247 10*3/uL (ref 150–400)
RBC: 5.22 MIL/uL (ref 4.22–5.81)
RDW: 12.6 % (ref 11.5–15.5)
WBC: 8 10*3/uL (ref 4.0–10.5)
nRBC: 0 % (ref 0.0–0.2)

## 2022-12-23 LAB — TROPONIN I (HIGH SENSITIVITY)
Troponin I (High Sensitivity): 4 ng/L (ref <18)
Troponin I (High Sensitivity): 4 ng/L (ref <18)

## 2022-12-23 NOTE — ED Notes (Signed)
Pt verbalizes understanding of discharge instructions. Opportunity for questioning and answers were provided. Pt discharged from ED to home with family.    

## 2022-12-23 NOTE — ED Triage Notes (Signed)
Pt reports CP x2 hours now with nausea and h/a. Pt denies any other medical hx or medication use

## 2022-12-23 NOTE — ED Provider Notes (Signed)
[facility] Provider Note    Event Date/Time   First MD Initiated Contact with Patient 12/23/22 1755     (approximate)   History   Chief Complaint Chest Pain   HPI  Douglas Braun is a 57 y.o. male with no significant past medical history who presents to the ED complaining of chest pain.  Patient reports that for the past 2 weeks he has been dealing with intermittent stabbing pain in the center of his chest.  Pain can come on at rest, does not seem to be exacerbated by activity.  Able typically last for an hour or 2 before resolving on its own.  He had an episode today that was more severe than before and so he decided to seek care in the ED.  At the time of my evaluation, he states that his pain has resolved and he denies any difficulty breathing.  He has not had any fevers or cough and denies any pain or swelling in his legs.     Physical Exam   Triage Vital Signs: ED Triage Vitals [12/23/22 1418]  Encounter Vitals Group     BP 139/82     Systolic BP Percentile      Diastolic BP Percentile      Pulse Rate 87     Resp 16     Temp 98.7 F (37.1 C)     Temp Source Oral     SpO2 93 %     Weight 280 lb (127 kg)     Height 6\' 4"  (1.93 m)     Head Circumference      Peak Flow      Pain Score 6     Pain Loc      Pain Education      Exclude from Growth Chart     Most recent vital signs: Vitals:   12/23/22 1418  BP: 139/82  Pulse: 87  Resp: 16  Temp: 98.7 F (37.1 C)  SpO2: 93%    Constitutional: Alert and oriented. Eyes: Conjunctivae are normal. Head: Atraumatic. Nose: No congestion/rhinnorhea. Mouth/Throat: Mucous membranes are moist.  Cardiovascular: Normal rate, regular rhythm. Grossly normal heart sounds.  2+ radial pulses bilaterally. Respiratory: Normal respiratory effort.  No retractions. Lungs CTAB.  No chest wall tenderness to palpation. Gastrointestinal: Soft and nontender. No distention. Musculoskeletal: No lower  extremity tenderness nor edema.  Neurologic:  Normal speech and language. No gross focal neurologic deficits are appreciated.    ED Results / Procedures / Treatments   Labs (all labs ordered are listed, but only abnormal results are displayed) Labs Reviewed  BASIC METABOLIC PANEL - Abnormal; Notable for the following components:      Result Value   Glucose, Bld 149 (*)    All other components within normal limits  CBC  TROPONIN I (HIGH SENSITIVITY)  TROPONIN I (HIGH SENSITIVITY)     EKG  ED ECG REPORT I, Chesley Noon, the attending physician, personally viewed and interpreted this ECG.   Date: 12/23/2022  EKG Time: 14:20  Rate: 87  Rhythm: normal sinus rhythm  Axis: Normal  Intervals:none  ST&T Change: Nonspecific T wave abnormality  RADIOLOGY Chest x-ray reviewed and interpreted by me with no infiltrate, edema, or effusion.  PROCEDURES:  Critical Care performed: No  Procedures   MEDICATIONS ORDERED IN ED: Medications - No data to display   IMPRESSION / MDM / ASSESSMENT AND PLAN / ED COURSE  I reviewed the triage vital signs and  the nursing notes.                              57 y.o. male with no significant past medical history who presents to the ED complaining of intermittent sharp pain in the center of his chest for the past 2 weeks, had a more severe episode today.  Patient's presentation is most consistent with acute presentation with potential threat to life or bodily function.  Differential diagnosis includes, but is not limited to, ACS, PE, pneumonia, pneumothorax, musculoskeletal pain, GERD, anxiety.  Patient nontoxic-appearing and in no acute distress, vital signs are unremarkable.  EKG shows nonspecific T wave abnormality but is otherwise reassuring, initial troponin within normal limits and symptoms seem atypical for ACS.  Given resolving symptoms with reassuring vital signs, doubt PE or dissection.  Chest x-ray is unremarkable and remainder of  labs are reassuring with no significant anemia, leukocytosis, electrolyte abnormality, or AKI.  We will check second set troponin, but if this is within normal limits then patient would be appropriate for outpatient follow-up.  Repeat troponin within normal limits, patient asymptomatic on reassessment.  He is appropriate for discharge home, states he follows with [facility] cardiology and was counseled to follow-up with them, otherwise return to the ED for new or worsening symptoms.  Patient agrees with plan.      FINAL CLINICAL IMPRESSION(S) / ED DIAGNOSES   Final diagnoses:  Nonspecific chest pain     Rx / DC Orders   ED Discharge Orders          Ordered    Ambulatory referral to Cardiology        12/23/22 1858             Note:  This document was prepared using Dragon voice recognition software and may include unintentional dictation errors.   Chesley Noon, MD 12/23/22 [phone]

## 2023-01-10 IMAGING — CR DG CHEST 2V
1 series · 2 of 2 positions shown · non-contrast
Comparison: 01/09/2016

CLINICAL DATA: Possible COVID

Headache
Fever
Chills
Nausea
EXAM:
CHEST - 2 VIEW

[Series 1: dg chest 2 view · 0.14mm/px · 2 of 2 slices shown]
[im 1/2]
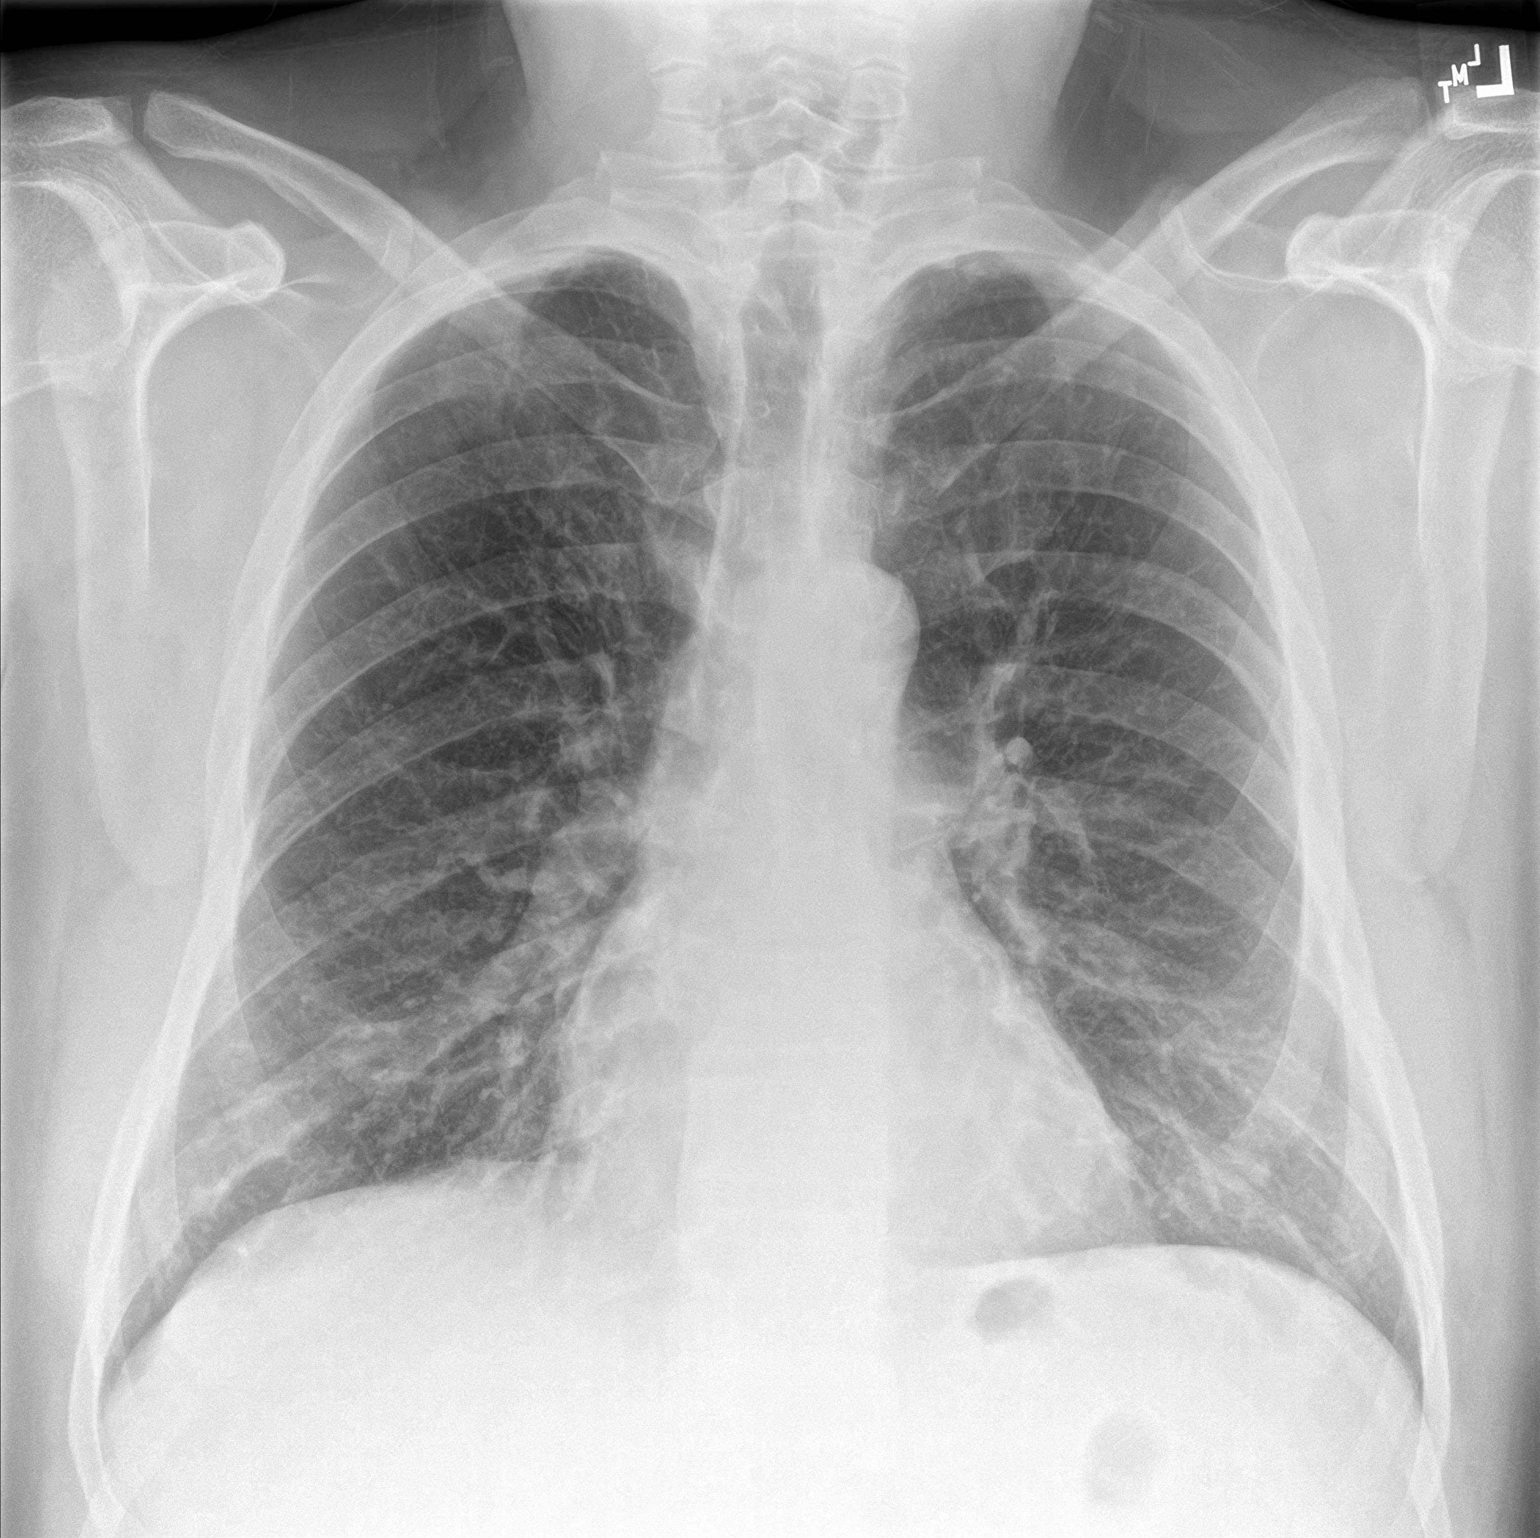
[im 2/2]
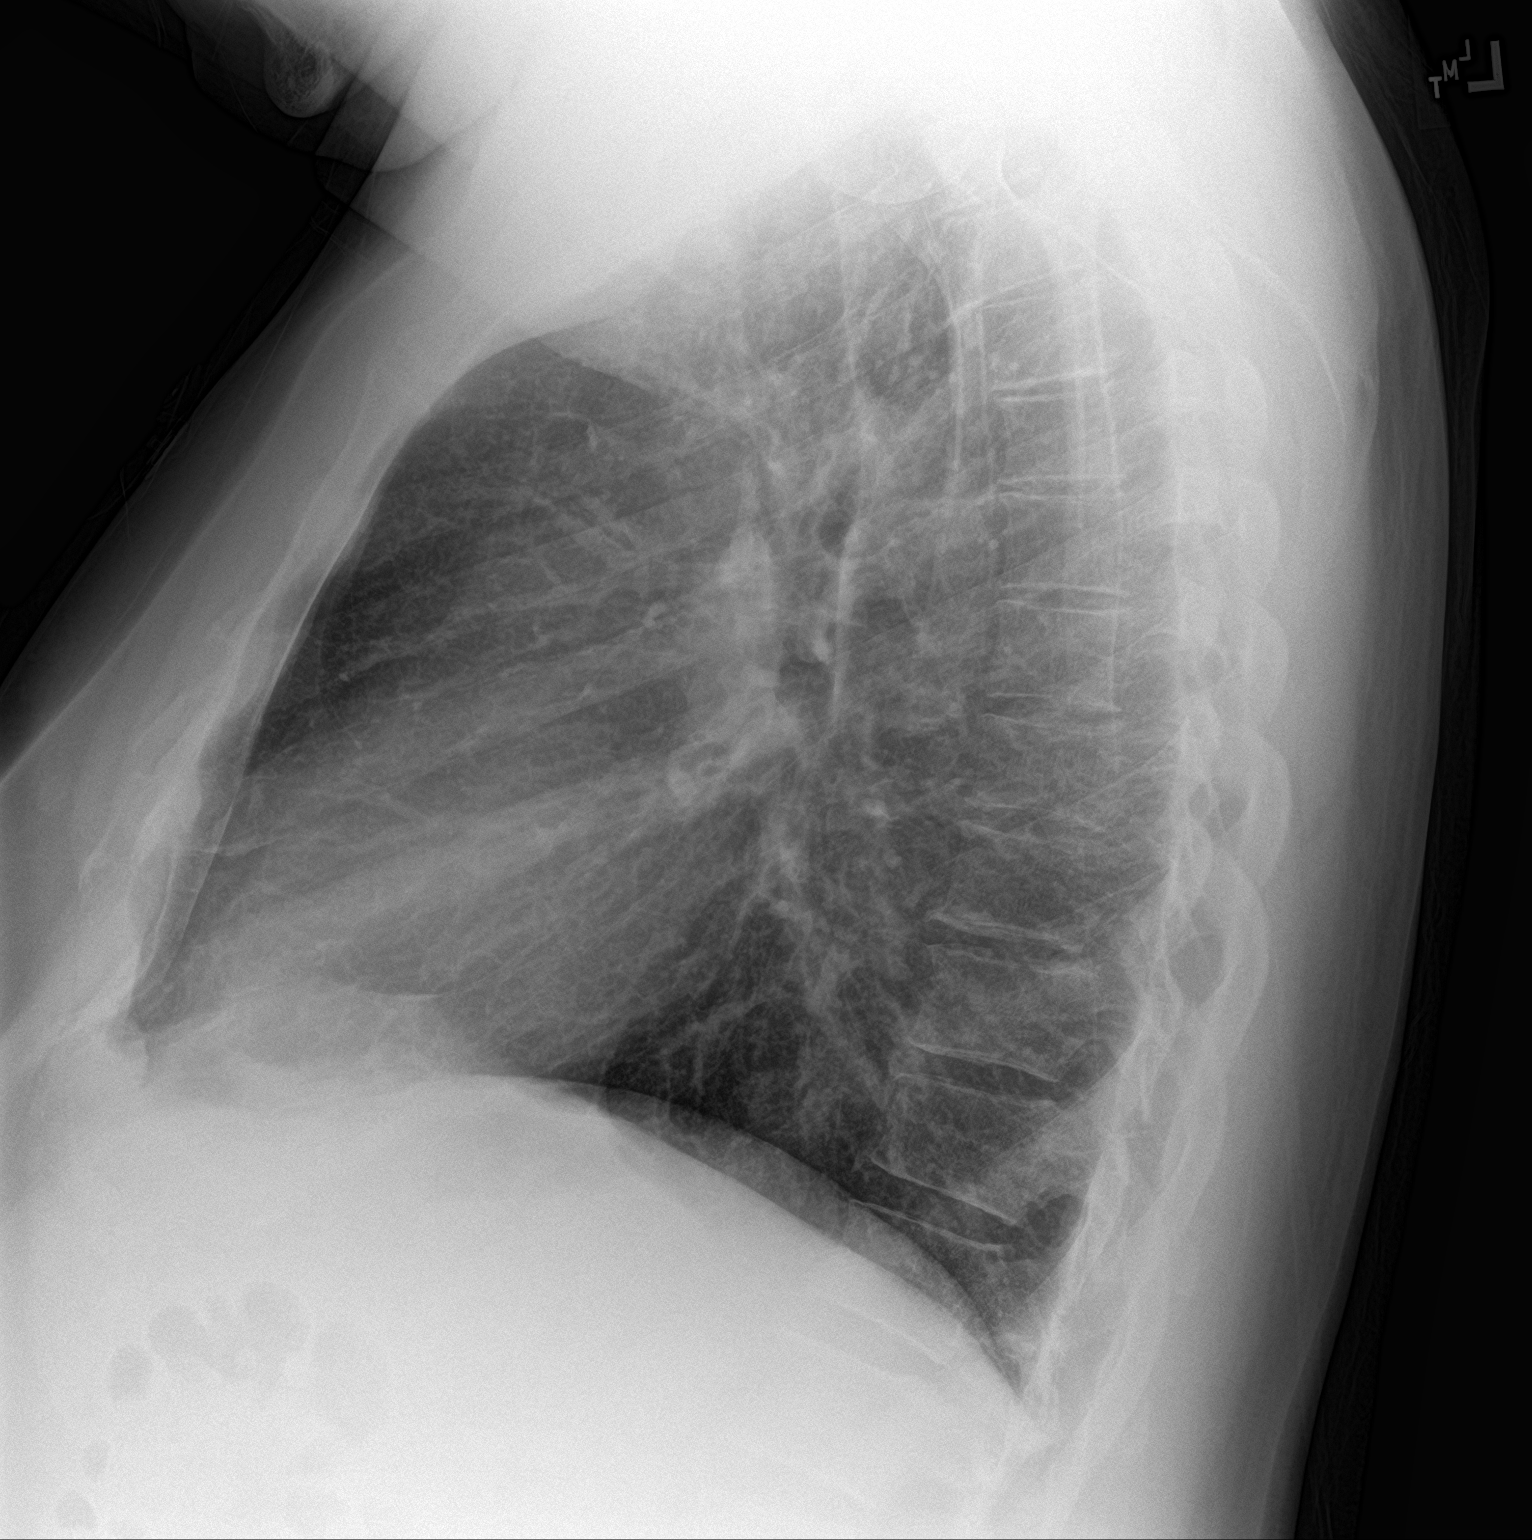

[2 of 2 positions shown; findings below may reference images not displayed]

FINDINGS: Patchy airspace opacity at the lung bases may be due to pneumonia or
atelectasis. The lungs are otherwise clear.

Cardiomediastinal silhouette and pulmonary vasculature are within
normal limits.
IMPRESSION: Patchy bibasilar airspace opacity may be due to pneumonia or
atelectasis.

## 2023-06-01 ENCOUNTER — Emergency Department: Payer: Self-pay

## 2023-06-01 ENCOUNTER — Emergency Department
Admission: EM | Admit: 2023-06-01 | Discharge: 2023-06-01 | Disposition: A | Discharge status: Home / Self Care | Payer: Self-pay | Attending: Emergency Medicine | Admitting: Emergency Medicine

## 2023-06-01 ENCOUNTER — Other Ambulatory Visit: Payer: Self-pay

## 2023-06-01 DIAGNOSIS — D72829 Elevated white blood cell count, unspecified: Secondary | ICD-10-CM | POA: Insufficient documentation

## 2023-06-01 DIAGNOSIS — L03115 Cellulitis of right lower limb: Secondary | ICD-10-CM | POA: Insufficient documentation

## 2023-06-01 LAB — CBC WITH DIFFERENTIAL/PLATELET
Abs Immature Granulocytes: 0.06 10*3/uL (ref 0.00–0.07)
Basophils Absolute: 0.1 10*3/uL (ref 0.0–0.1)
Basophils Relative: 1 %
Eosinophils Absolute: 0.1 10*3/uL (ref 0.0–0.5)
Eosinophils Relative: 1 %
HCT: 47.6 % (ref 39.0–52.0)
Hemoglobin: 16.9 g/dL (ref 13.0–17.0)
Immature Granulocytes: 1 %
Lymphocytes Relative: 10 %
Lymphs Abs: 1.3 10*3/uL (ref 0.7–4.0)
MCH: 31.6 pg (ref 26.0–34.0)
MCHC: 35.5 g/dL (ref 30.0–36.0)
MCV: 89.1 fL (ref 80.0–100.0)
Monocytes Absolute: 1 10*3/uL (ref 0.1–1.0)
Monocytes Relative: 8 %
Neutro Abs: 10.6 10*3/uL — ABNORMAL HIGH (ref 1.7–7.7)
Neutrophils Relative %: 79 %
Platelets: 203 10*3/uL (ref 150–400)
RBC: 5.34 MIL/uL (ref 4.22–5.81)
RDW: 12.5 % (ref 11.5–15.5)
WBC: 13.1 10*3/uL — ABNORMAL HIGH (ref 4.0–10.5)
nRBC: 0 % (ref 0.0–0.2)

## 2023-06-01 LAB — BASIC METABOLIC PANEL WITH GFR
Anion gap: 13 (ref 5–15)
BUN: 20 mg/dL (ref 6–20)
CO2: 21 mmol/L — ABNORMAL LOW (ref 22–32)
Calcium: 8.3 mg/dL — ABNORMAL LOW (ref 8.9–10.3)
Chloride: 101 mmol/L (ref 98–111)
Creatinine, Ser: 0.92 mg/dL (ref 0.61–1.24)
GFR, Estimated: >60 mL/min (ref >60)
Glucose, Bld: 94 mg/dL (ref 70–99)
Potassium: 3.9 mmol/L (ref 3.5–5.1)
Sodium: 135 mmol/L (ref 135–145)

## 2023-06-01 LAB — SEDIMENTATION RATE: Sed Rate: 5 mm/h (ref 0–20)

## 2023-06-01 MED ORDER — SULFAMETHOXAZOLE-TRIMETHOPRIM 800-160 MG PO TABS
1.0000 | ORAL_TABLET | Freq: Once | ORAL | Status: AC
  Administered 2023-06-01: 1 via ORAL
  Filled 2023-06-01: qty 1

## 2023-06-01 MED ORDER — SULFAMETHOXAZOLE-TRIMETHOPRIM 800-160 MG PO TABS: 1.0000 | ORAL_TABLET | Freq: Two times a day (BID) | ORAL | 0 refills | Status: AC

## 2023-06-01 MED ORDER — OXYCODONE HCL 5 MG PO TABS
5.0000 mg | ORAL_TABLET | Freq: Once | ORAL | Status: AC
  Administered 2023-06-01: 5 mg via ORAL
  Filled 2023-06-01: qty 1

## 2023-06-01 MED ORDER — ACETAMINOPHEN 500 MG PO TABS
1000.0000 mg | ORAL_TABLET | Freq: Once | ORAL | Status: AC
  Administered 2023-06-01: 1000 mg via ORAL
  Filled 2023-06-01: qty 2

## 2023-06-01 MED ORDER — KETOROLAC TROMETHAMINE 30 MG/ML IJ SOLN
15.0000 mg | Freq: Once | INTRAMUSCULAR | Status: AC
  Administered 2023-06-01: 15 mg via INTRAVENOUS
  Filled 2023-06-01: qty 1

## 2023-06-01 MED ORDER — IOHEXOL 350 MG/ML SOLN
75.0000 mL | Freq: Once | INTRAVENOUS | Status: AC | PRN
Start: 2023-06-01 — End: 2023-06-01
  Administered 2023-06-01: 75 mL via INTRAVENOUS

## 2023-06-01 NOTE — ED Provider Notes (Signed)
 [facility] Provider Note    Event Date/Time   First MD Initiated Contact with Patient 06/01/23 1546     (approximate)   History   Leg Swelling   HPI  Douglas Braun is a 58 y.o. male who presents to the ED for evaluation of Leg Swelling   Reviewed orthopedic clinic visit from 2021.  Following up after right knee arthroscopy, partial meniscectomy  Patient presents to the ED with a painful spot to his proximal right shin just beneath his knee.  Reports no precipitating trauma, injuries but is on his knees a lot for work.  Developed a painful "boil" about 1 week ago, it drained purulent and bloody material a few days ago and he reports feeling better for couple days, until re-worsening in the past 1 day with more severe pain to this localized area.  No known fevers or systemic symptoms.  Reports pain to the medial side of his right knee near this area that radiates up and down his leg   Physical Exam   Triage Vital Signs: ED Triage Vitals  Encounter Vitals Group     BP 06/01/23 1515 (!) 146/76     Systolic BP Percentile --      Diastolic BP Percentile --      Pulse Rate 06/01/23 1515 98     Resp 06/01/23 1515 (!) 22     Temp 06/01/23 1515 97.9 F (36.6 C)     Temp Source 06/01/23 1515 Oral     SpO2 06/01/23 1515 98 %     Weight 06/01/23 1516 285 lb (129.3 kg)     Height 06/01/23 1516 6\' 4"  (1.93 m)     Head Circumference --      Peak Flow --      Pain Score 06/01/23 1516 8     Pain Loc --      Pain Education --      Exclude from Growth Chart --     Most recent vital signs: Vitals:   06/01/23 1515  BP: (!) 146/76  Pulse: 98  Resp: (!) 22  Temp: 97.9 F (36.6 C)  SpO2: 98%    General: Awake, no distress.  CV:  Good peripheral perfusion.  Resp:  Normal effort.  Abd:  No distention.  MSK:  No deformity noted.  As pictured below, erythematous flat area to the proximal right shin overlying the distal aspect of the patellar tendon.  Has  pain when palpating this area locally as well as the patellar tendon.  Some pain anteriorly with passive ranging the knee.  No fluctuance or expressible material.  Some induration, warmth is present.  No calf or shin tenderness separate from this lesion.  Foot is neurovascularly intact distal to this Neuro:  No focal deficits appreciated. Other:     ED Results / Procedures / Treatments   Labs (all labs ordered are listed, but only abnormal results are displayed) Labs Reviewed  CBC WITH DIFFERENTIAL/PLATELET - Abnormal; Notable for the following components:      Result Value   WBC 13.1 (*)    Neutro Abs 10.6 (*)    All other components within normal limits  BASIC METABOLIC PANEL WITH GFR - Abnormal; Notable for the following components:   CO2 21 (*)    Calcium 8.3 (*)    All other components within normal limits  SEDIMENTATION RATE    EKG   RADIOLOGY CT of the knee interpreted by me without evidence of  a deep abscess Ultrasound of the right leg interpreted by me without signs of DVT  Official radiology report(s): US  Venous Img Lower Unilateral Right Result Date: 06/01/2023 CLINICAL DATA:  Right lower extremity swelling and pain EXAM: RIGHT LOWER EXTREMITY VENOUS DOPPLER ULTRASOUND TECHNIQUE: Gray-scale sonography with compression, as well as color and duplex ultrasound, were performed to evaluate the deep venous system(s) from the level of the common femoral vein through the popliteal and proximal calf veins. COMPARISON:  CT 06/01/2023 FINDINGS: VENOUS Normal compressibility of the common femoral, superficial femoral, and popliteal veins, as well as the visualized calf veins. Visualized portions of profunda femoral vein and great saphenous vein unremarkable. No filling defects to suggest DVT on grayscale or color Doppler imaging. Doppler waveforms show normal direction of venous flow, normal respiratory plasticity and response to augmentation. Limited views of the contralateral  common femoral vein are unremarkable. OTHER Right inguinal adenopathy measuring 3.9 x 1.4 cm, likely reactive. Limitations: none IMPRESSION: 1. No evidence of deep venous thrombosis within the right lower extremity. 2. Reactive right inguinal adenopathy, measuring up to 1.4 cm in short axis. Electronically Signed   By: Bobbye Burrow M.D.   On: 06/01/2023 18:10   CT KNEE RIGHT W CONTRAST Result Date: 06/01/2023 CLINICAL DATA:  Inflammation anteriorly along the patellar tendon, swelling EXAM: CT OF THE RIGHT KNEE WITH CONTRAST TECHNIQUE: Multidetector CT imaging was performed following the standard protocol during bolus administration of intravenous contrast. RADIATION DOSE REDUCTION: This exam was performed according to the departmental dose-optimization program which includes automated exposure control, adjustment of the mA and/or kV according to patient size and/or use of iterative reconstruction technique. CONTRAST:  75mL OMNIPAQUE IOHEXOL 350 MG/ML SOLN COMPARISON:  MRI 04/02/2019 FINDINGS: Bones/Joint/Cartilage Mild marginal spurring in all 3 compartments compatible with osteoarthritis. No fracture or acute bony finding. No bony destructive findings. Chronically fragmented spurring of the patella along the proximal patellar tendon. Trace knee effusion. Ligaments Suboptimally assessed by CT. Muscles and Tendons Unremarkable Soft tissues Infiltrative subcutaneous edema anterior to the patella and patellar tendon, and tracking down anterior to the tibial tubercle and along the medial superficial fascia margin of the medial compartment. Overlying cutaneous thickening. No definite enhancing margins of the prepatellar collection to indicate abscess. A small component of prepatellar bursitis is not totally excluded and was present on 04/02/2019, but overall the appearance today is primarily infiltrative and cellulitis is not excluded. IMPRESSION: 1. Infiltrative subcutaneous edema anterior to the patella and  patellar tendon, and tracking down anterior to the tibial tubercle and along the medial superficial fascia margin of the medial compartment. Overlying cutaneous thickening. No definite enhancing margins of the prepatellar collection to indicate abscess. A small component of prepatellar bursitis is not totally excluded and was present on 04/02/2019, but overall the appearance today is primarily infiltrative and cellulitis is not excluded. 2. Trace knee effusion. 3. Mild tricompartmental osteoarthritis. 4. Chronically fragmented spurring of the patella along the proximal patellar tendon. Electronically Signed   By: Freida Jes M.D.   On: 06/01/2023 17:28    PROCEDURES and INTERVENTIONS:  Procedures  Medications  sulfamethoxazole-trimethoprim (BACTRIM DS) 800-160 MG per tablet 1 tablet (1 tablet Oral Given 06/01/23 1641)  acetaminophen  (TYLENOL ) tablet 1,000 mg (1,000 mg Oral Given 06/01/23 1642)  oxyCODONE  (Oxy IR/ROXICODONE ) immediate release tablet 5 mg (5 mg Oral Given 06/01/23 1641)  ketorolac  (TORADOL ) 30 MG/ML injection 15 mg (15 mg Intravenous Given 06/01/23 1642)  iohexol (OMNIPAQUE) 350 MG/ML injection 75 mL (75 mLs Intravenous Contrast  Given 06/01/23 1701)     IMPRESSION / MDM / ASSESSMENT AND PLAN / ED COURSE  I reviewed the triage vital signs and the nursing notes.  Differential diagnosis includes, but is not limited to, cellulitis, deep space abscess, septic joint, DVT  {Patient presents with symptoms of an acute illness or injury that is potentially life-threatening.  Patient presents with an erythematous lesion that is likely cellulitis and suitable for outpatient management antibiotics.  Reports purulence earlier this week that has since improved.  No systemic symptoms.  No signs of DVT on ultrasound, CT without deep space abscess.  Just localized inflammatory changes near the patellar tendon likely causing his pain with ranging and palpation.  Mild leukocytosis but he looks  well and I doubt sepsis.  Normal renal function and ESR.  Start oral antibiotics, discharged with the same and discussed expectant management.  Clinical Course as of 06/01/23 1820  Mon Jun 01, 2023  1731 Reassessed.  Pain significantly improved.  We discussed my recommendation for an ultrasound as well and he is agreeable. [DS]  1819 Reassessed and discussed workup overall.  Expectant management and ED return precautions.  Answered questions. [DS]    Clinical Course User Index [DS] Arline Bennett, MD     FINAL CLINICAL IMPRESSION(S) / ED DIAGNOSES   Final diagnoses:  Cellulitis of right lower extremity     Rx / DC Orders   ED Discharge Orders          Ordered    sulfamethoxazole-trimethoprim (BACTRIM DS) 800-160 MG tablet  2 times daily        06/01/23 1820             Note:  This document was prepared using Dragon voice recognition software and may include unintentional dictation errors.   Arline Bennett, MD 06/01/23 Jerre Moots

## 2023-06-01 NOTE — Discharge Instructions (Signed)
 Please take Tylenol  and ibuprofen/Advil for your pain.  It is safe to take them together, or to alternate them every few hours.  Take up to 1000mg  of Tylenol  at a time, up to 4 times per day.  Do not take more than 4000 mg of Tylenol  in 24 hours.  For ibuprofen, take 400-600 mg, 3 - 4 times per day.  Bactrim antibiotics twice daily for 5 days.  Finish all 10 pills  Ice, elevation and compression can help with the pain  Return to the ED with any worsening symptoms despite these measures.

## 2023-06-01 NOTE — ED Triage Notes (Signed)
 Pt reports RLE swelling and pain that has worsened over the past week. Pt has had an abscess just below the knee. Pt reports his calf is more swollen and the pain radiates into his groin. Erythema present around abscess. Abscess is scabbed over. Pt denies any hx of blood clots.

## 2023-06-04 ENCOUNTER — Emergency Department: Admission: EM | Admit: 2023-06-04 | Discharge: 2023-06-04 | Discharge status: Against Medical Advice | Payer: Self-pay

## 2023-06-04 ENCOUNTER — Emergency Department (HOSPITAL_COMMUNITY)
Admission: EM | Admit: 2023-06-04 | Discharge: 2023-06-05 | Discharge status: Against Medical Advice | Payer: Self-pay | Attending: Emergency Medicine | Admitting: Emergency Medicine

## 2023-06-04 ENCOUNTER — Encounter (HOSPITAL_COMMUNITY): Payer: Self-pay

## 2023-06-04 ENCOUNTER — Encounter: Payer: Self-pay | Admitting: *Deleted

## 2023-06-04 ENCOUNTER — Other Ambulatory Visit: Payer: Self-pay

## 2023-06-04 DIAGNOSIS — Z5321 Procedure and treatment not carried out due to patient leaving prior to being seen by health care provider: Secondary | ICD-10-CM | POA: Insufficient documentation

## 2023-06-04 DIAGNOSIS — M7989 Other specified soft tissue disorders: Secondary | ICD-10-CM | POA: Insufficient documentation

## 2023-06-04 DIAGNOSIS — M79661 Pain in right lower leg: Secondary | ICD-10-CM | POA: Insufficient documentation

## 2023-06-04 LAB — CBC
HCT: 42 % (ref 39.0–52.0)
Hemoglobin: 14.9 g/dL (ref 13.0–17.0)
MCH: 31.4 pg (ref 26.0–34.0)
MCHC: 35.5 g/dL (ref 30.0–36.0)
MCV: 88.4 fL (ref 80.0–100.0)
Platelets: 223 10*3/uL (ref 150–400)
RBC: 4.75 MIL/uL (ref 4.22–5.81)
RDW: 12.7 % (ref 11.5–15.5)
WBC: 11.2 10*3/uL — ABNORMAL HIGH (ref 4.0–10.5)
nRBC: 0 % (ref 0.0–0.2)

## 2023-06-04 LAB — BASIC METABOLIC PANEL WITH GFR
Anion gap: 8 (ref 5–15)
BUN: 18 mg/dL (ref 6–20)
CO2: 22 mmol/L (ref 22–32)
Calcium: 9.3 mg/dL (ref 8.9–10.3)
Chloride: 104 mmol/L (ref 98–111)
Creatinine, Ser: 1.13 mg/dL (ref 0.61–1.24)
GFR, Estimated: >60 mL/min (ref >60)
Glucose, Bld: 106 mg/dL — ABNORMAL HIGH (ref 70–99)
Potassium: 4 mmol/L (ref 3.5–5.1)
Sodium: 134 mmol/L — ABNORMAL LOW (ref 135–145)

## 2023-06-04 LAB — CBC WITH DIFFERENTIAL/PLATELET
Abs Immature Granulocytes: 0.05 10*3/uL (ref 0.00–0.07)
Basophils Absolute: 0.1 10*3/uL (ref 0.0–0.1)
Basophils Relative: 1 %
Eosinophils Absolute: 0.3 10*3/uL (ref 0.0–0.5)
Eosinophils Relative: 3 %
HCT: 43.6 % (ref 39.0–52.0)
Hemoglobin: 15.1 g/dL (ref 13.0–17.0)
Immature Granulocytes: 1 %
Lymphocytes Relative: 23 %
Lymphs Abs: 2.5 10*3/uL (ref 0.7–4.0)
MCH: 32 pg (ref 26.0–34.0)
MCHC: 34.6 g/dL (ref 30.0–36.0)
MCV: 92.4 fL (ref 80.0–100.0)
Monocytes Absolute: 1.4 10*3/uL — ABNORMAL HIGH (ref 0.1–1.0)
Monocytes Relative: 13 %
Neutro Abs: 6.4 10*3/uL (ref 1.7–7.7)
Neutrophils Relative %: 59 %
Platelets: 202 10*3/uL (ref 150–400)
RBC: 4.72 MIL/uL (ref 4.22–5.81)
RDW: 12.8 % (ref 11.5–15.5)
WBC: 10.6 10*3/uL — ABNORMAL HIGH (ref 4.0–10.5)
nRBC: 0 % (ref 0.0–0.2)

## 2023-06-04 LAB — COMPREHENSIVE METABOLIC PANEL WITH GFR
ALT: 74 U/L — ABNORMAL HIGH (ref 0–44)
AST: 49 U/L — ABNORMAL HIGH (ref 15–41)
Albumin: 3.5 g/dL (ref 3.5–5.0)
Alkaline Phosphatase: 97 U/L (ref 38–126)
Anion gap: 11 (ref 5–15)
BUN: 20 mg/dL (ref 6–20)
CO2: 21 mmol/L — ABNORMAL LOW (ref 22–32)
Calcium: 9 mg/dL (ref 8.9–10.3)
Chloride: 102 mmol/L (ref 98–111)
Creatinine, Ser: 1.07 mg/dL (ref 0.61–1.24)
GFR, Estimated: >60 mL/min (ref >60)
Glucose, Bld: 99 mg/dL (ref 70–99)
Potassium: 3.9 mmol/L (ref 3.5–5.1)
Sodium: 134 mmol/L — ABNORMAL LOW (ref 135–145)
Total Bilirubin: 1 mg/dL (ref 0.0–1.2)
Total Protein: 7.4 g/dL (ref 6.5–8.1)

## 2023-06-04 LAB — I-STAT CG4 LACTIC ACID, ED: Lactic Acid, Venous: 0.7 mmol/L (ref 0.5–1.9)

## 2023-06-04 NOTE — ED Triage Notes (Signed)
 Pt arrived POV from home stating he went to [facility] on Monday with a small area to his right leg that was red, swollen and warm to touch. Pt states they told him he had cellulitis and gave him bactrim . Pt states he has taken the antibiotic since Monday but his leg is getting worse. Now his entire right leg is painful, red, warm and swollen.

## 2023-06-04 NOTE — ED Triage Notes (Signed)
 Pt to triage via wheelchair.  Pt has swelling, redness and pain in right lower leg.  Sx for 4 days.  Pt was seen in er 4 days ago.  Pt states leg is worse.  Pt on abx.  Pt alert

## 2023-06-04 NOTE — ED Notes (Signed)
 1st 0.7 within normal limits can be discontinued

## 2023-06-04 NOTE — ED Provider Triage Note (Signed)
 Emergency Medicine Provider Triage Evaluation Note  Douglas Braun , a 58 y.o. male  was evaluated in triage.  Pt complains of right lower extremity pain and erythema.  Evaluated at [facility] on Monday started on Bactrim for presumed cellulitis.  Ultrasound and CT of his knee performed at that time.  Review of Systems  Positive:  Negative:   Physical Exam  BP 130/74 (BP Location: Right Arm)   Pulse 81   Temp 97.7 F (36.5 C)   Resp 14   Ht 6\' 3"  (1.905 m)   Wt 129.3 kg   SpO2 91%   BMI 35.62 kg/m  Gen:   Awake, no distress   Resp:  Normal effort  MSK:   Right lower extremity edema, erythema, increased warmth, tolerates full range of motion of the knee with PT, DP pulses 2+ Other:    Medical Decision Making  Medically screening exam initiated at 9:40 PM.  Appropriate orders placed.  Douglas Braun was informed that the remainder of the evaluation will be completed by another provider, this initial triage assessment does not replace that evaluation, and the importance of remaining in the ED until their evaluation is complete.     Felicie Horning, PA-C 06/04/23 2141

## 2023-06-04 NOTE — ED Notes (Signed)
 [facility] ED called to report pt presented there to  be seen; left our ED without notifying staff

## 2023-06-05 NOTE — ED Notes (Signed)
 Called for vitals several times over 45+ mins and no response

## 2023-06-09 LAB — CULTURE, BLOOD (ROUTINE X 2): Culture: NO GROWTH

## 2024-02-07 ENCOUNTER — Other Ambulatory Visit: Payer: Self-pay

## 2024-02-07 ENCOUNTER — Emergency Department
Admission: EM | Admit: 2024-02-07 | Discharge: 2024-02-07 | Disposition: A | Discharge status: Home / Self Care | Payer: Self-pay | Attending: Emergency Medicine | Admitting: Emergency Medicine

## 2024-02-07 ENCOUNTER — Emergency Department: Payer: Self-pay

## 2024-02-07 DIAGNOSIS — S82831A Other fracture of upper and lower end of right fibula, initial encounter for closed fracture: Secondary | ICD-10-CM | POA: Insufficient documentation

## 2024-02-07 DIAGNOSIS — Y990 Civilian activity done for income or pay: Secondary | ICD-10-CM | POA: Insufficient documentation

## 2024-02-07 DIAGNOSIS — S82391A Other fracture of lower end of right tibia, initial encounter for closed fracture: Secondary | ICD-10-CM

## 2024-02-07 DIAGNOSIS — W010XXA Fall on same level from slipping, tripping and stumbling without subsequent striking against object, initial encounter: Secondary | ICD-10-CM | POA: Insufficient documentation

## 2024-02-07 DIAGNOSIS — S82839A Other fracture of upper and lower end of unspecified fibula, initial encounter for closed fracture: Secondary | ICD-10-CM

## 2024-02-07 MED ORDER — TRAMADOL HCL 50 MG PO TABS: 50.0000 mg | ORAL_TABLET | Freq: Four times a day (QID) | ORAL | 0 refills | Status: DC | PRN

## 2024-02-07 MED ORDER — TRAMADOL HCL 50 MG PO TABS
50.0000 mg | ORAL_TABLET | Freq: Once | ORAL | Status: AC
  Administered 2024-02-07: 50 mg via ORAL
  Filled 2024-02-07: qty 1

## 2024-02-07 NOTE — ED Notes (Signed)
 Pt verbalizes understanding of discharge instructions. Opportunity for questioning and answers were provided. Pt discharged from ED with family.

## 2024-02-07 NOTE — Discharge Instructions (Addendum)
 IMPRESSION: 1. Oblique comminuted distal fibular fracture, with mild displacement of fracture fragments. 2. Small avulsion fracture fragments off the medial aspect of the tibial plafond and posterior malleolus. 3. Lateral subluxation of the tibiotalar joint, with widening of the medial and anterior joint space. 4. Small right ankle effusion. 5. Diffuse soft tissue swelling.  Please read through the included information about splint care (keep it clean and dry).  If your pain becomes much more severe, the splint becomes too tight, or you feel as if your injured limb is becoming numb or cold, please return immediately to the Emergency Department.  Follow up with the podiatry (Dr. Lennie) specialist listed in this paperwork.  He said you can call his office and follow up on Wednesday. When possible, keep your splint elevated to help with the swelling.  You may also use ice packs over the splint.  Have sent some medication in for pain.  Please take it as prescribed.  It may make you sleepy or loopy so do not work, drive, make any legal body decisions, drink alcohol or, any ladders or heights while taking this medication.  Do not bear weight on the ankle and use crutches at all times.

## 2024-02-07 NOTE — ED Triage Notes (Signed)
 Pt comes with slip and fall today. Pt injured his right ankle. Pt denies any loc or hitting head.

## 2024-02-07 NOTE — ED Notes (Signed)
 Pt transported to CT ?

## 2024-02-07 NOTE — ED Notes (Signed)
 Splint applied by RN and NT.

## 2024-02-10 ENCOUNTER — Other Ambulatory Visit: Payer: Self-pay

## 2024-02-10 ENCOUNTER — Other Ambulatory Visit: Payer: Self-pay | Admitting: Podiatry

## 2024-02-10 ENCOUNTER — Emergency Department
Admission: RE | Admit: 2024-02-10 | Discharge: 2024-02-10 | Disposition: A | Payer: Self-pay | Source: Ambulatory Visit | Attending: Podiatry

## 2024-02-10 ENCOUNTER — Emergency Department
Admission: EM | Admit: 2024-02-10 | Discharge: 2024-02-10 | Disposition: A | Discharge status: Home / Self Care | Payer: Self-pay | Attending: Emergency Medicine | Admitting: Emergency Medicine

## 2024-02-10 ENCOUNTER — Emergency Department: Payer: Self-pay

## 2024-02-10 DIAGNOSIS — S82831A Other fracture of upper and lower end of right fibula, initial encounter for closed fracture: Secondary | ICD-10-CM | POA: Insufficient documentation

## 2024-02-10 DIAGNOSIS — W19XXXA Unspecified fall, initial encounter: Secondary | ICD-10-CM | POA: Insufficient documentation

## 2024-02-10 DIAGNOSIS — S90521A Blister (nonthermal), right ankle, initial encounter: Secondary | ICD-10-CM | POA: Insufficient documentation

## 2024-02-10 DIAGNOSIS — S82831D Other fracture of upper and lower end of right fibula, subsequent encounter for closed fracture with routine healing: Secondary | ICD-10-CM

## 2024-02-10 HISTORY — DX: Other fracture of right lower leg, initial encounter for closed fracture: S82.891A

## 2024-02-10 MED ORDER — OXYCODONE HCL 5 MG PO TABS
10.0000 mg | ORAL_TABLET | Freq: Once | ORAL | Status: AC
  Administered 2024-02-10: 10 mg via ORAL
  Filled 2024-02-10: qty 2

## 2024-02-10 MED ORDER — ONDANSETRON 4 MG PO TBDP: 4.0000 mg | ORAL_TABLET | Freq: Four times a day (QID) | ORAL | 0 refills | Status: AC | PRN

## 2024-02-10 MED ORDER — ONDANSETRON 4 MG PO TBDP
4.0000 mg | ORAL_TABLET | Freq: Once | ORAL | Status: AC
  Administered 2024-02-10: 4 mg via ORAL
  Filled 2024-02-10: qty 1

## 2024-02-10 MED ORDER — OXYCODONE HCL 5 MG PO TABS: 10.0000 mg | ORAL_TABLET | Freq: Three times a day (TID) | ORAL | 0 refills | Status: DC | PRN

## 2024-02-10 NOTE — Discharge Instructions (Addendum)
 You may alternate over the counter Tylenol  1000 mg every 6 hours as needed for pain, fever and Ibuprofen 800 mg every 6-8 hours as needed for pain, fever.  Please take Ibuprofen with food.  Do not take more than 4000 mg of Tylenol  (acetaminophen ) in a 24 hour period.  You are being provided a prescription for opiates (also known as narcotics) for pain control.  Opiates can be addictive and should only be used when absolutely necessary for pain control when other alternatives do not work.  We recommend you only use them for the recommended amount of time and only as prescribed.  Please do not take with other sedative medications or alcohol.  Please do not drive, operate machinery, make important decisions while taking opiates.  Please note that these medications can be addictive and have high abuse potential.  Patients can become addicted to narcotics after only taking them for a few days.  Please keep these medications locked away from children, teenagers or any family members with history of substance abuse.  Narcotic pain medicine may also make you constipated.  You may use over-the-counter medications such as MiraLAX, Colace to prevent constipation.  If you become constipated, you may use over-the-counter enemas as needed.  Itching and nausea are also common side effects of narcotic pain medication.  If you develop uncontrolled vomiting or a rash, please stop these medications and seek medical care.  Please stop your tramadol .  Please follow-up with Dr. Lennie as scheduled tomorrow.  Please keep your splint on at all times and keep it clean, dry.  Please keep your leg elevated at rest which will help with swelling, bruising and discomfort.

## 2024-02-10 NOTE — ED Notes (Signed)
 Pt given discharge paperwork and instructions. Pt verbalized understanding. Pt wheeled out of ED by staff in no acute distress accompanied by family.

## 2024-02-10 NOTE — Patient Instructions (Signed)
 Your procedure is scheduled on:02-12-24 Friday Report to the Registration Desk on the 1st floor of the [facility].Then proceed to the 2nd floor Surgery Desk To find out your arrival time, please call [phone] between 1PM - 3PM on:02-11-24 Thursday If your arrival time is 6:00 am, do not arrive before that time as the [facility] entrance doors do not open until 6:00 am.  REMEMBER: Instructions that are not followed completely may result in serious medical risk, up to and including death; or upon the discretion of your surgeon and anesthesiologist your surgery may need to be rescheduled.  Do not eat food after midnight the night before surgery.  No gum chewing or hard candies.  You may however, drink CLEAR liquids up to 2 hours before you are scheduled to arrive for your surgery. Do not drink anything within 2 hours of your scheduled arrival time.  Clear liquids include: - water  - apple juice without pulp - gatorade (not RED colors) - black coffee or tea (Do NOT add milk or creamers to the coffee or tea) Do NOT drink anything that is not on this list.  One week prior to surgery:Stop NOW (02-10-24) Stop Anti-inflammatories (NSAIDS) such as Advil, Aleve , Ibuprofen, Motrin, Naproxen , Naprosyn  and Aspirin  based products such as Excedrin, Goody's Powder, BC Powder. Stop ANY OVER THE COUNTER supplements until after surgery.  You may however, continue to take Tylenol /Oxycodone  if needed for pain up until the day of surgery.  Continue taking all of your other prescription medications up until the day of surgery.  ON THE DAY OF SURGERY ONLY TAKE THESE MEDICATIONS WITH SIPS OF WATER: -You may take oxyCODONE  (ROXICODONE ) for pain if needed -You may take ondansetron  (ZOFRAN -ODT) for nausea/vomiting if needed  No Alcohol for 24 hours before or after surgery.  No Smoking including e-cigarettes for 24 hours before surgery.  No chewable tobacco products for at least 6 hours before surgery.  No  nicotine patches on the day of surgery.  Do not use any recreational drugs for at least a week (preferably 2 weeks) before your surgery.  Please be advised that the combination of cocaine and anesthesia may have negative outcomes, up to and including death. If you test positive for cocaine, your surgery will be cancelled.  On the morning of surgery brush your teeth with toothpaste and water, you may rinse your mouth with mouthwash if you wish. Do not swallow any toothpaste or mouthwash.  Do not wear jewelry, make-up, hairpins, clips or nail polish.  For welded (permanent) jewelry: bracelets, anklets, waist bands, etc.  Please have this removed prior to surgery.  If it is not removed, there is a chance that hospital personnel will need to cut it off on the day of surgery.  Do not wear lotions, powders, or perfumes.   Do not shave body hair from the neck down 48 hours before surgery.  Contact lenses, hearing aids and dentures may not be worn into surgery.  Do not bring valuables to the hospital. [facility] is not responsible for any missing/lost belongings or valuables.   Notify your doctor if there is any change in your medical condition (cold, fever, infection).  Wear comfortable clothing (specific to your surgery type) to the hospital.  After surgery, you can help prevent lung complications by doing breathing exercises.  Take deep breaths and cough every 1-2 hours. Your doctor may order a device called an Incentive Spirometer to help you take deep breaths. When coughing or sneezing, hold  a pillow firmly against your incision with both hands. This is called splinting. Doing this helps protect your incision. It also decreases belly discomfort.  If you are being admitted to the hospital overnight, leave your suitcase in the car. After surgery it may be brought to your room.  In case of increased patient census, it may be necessary for you, the patient, to continue your postoperative  care in the Same Day Surgery department.  If you are being discharged the day of surgery, you will not be allowed to drive home. You will need a responsible individual to drive you home and stay with you for 24 hours after surgery.   If you are taking public transportation, you will need to have a responsible individual with you.  Please call the Pre-admissions Testing Dept. at [phone] if you have any questions about these instructions.  Surgery Visitation Policy:  Patients having surgery or a procedure may have two visitors.  Children under the age of 72 must have an adult with them who is not the patient.   Merchandiser, Retail to address health-related social needs:  [email or web address]

## 2024-02-10 NOTE — ED Provider Notes (Signed)
 "  [facility] Provider Note    Event Date/Time   First MD Initiated Contact with Patient 02/10/24 0110     (approximate)   History   Ankle Pain   HPI  Douglas Braun is a 59 y.o. male with history of chronic peripheral neuropathy who presents to the emergency department with concerns for a blister to his right ankle.  Patient was seen in the emergency department on 02/07/2024 after he had a fall and was found to have a distal fibular fracture and was placed into a posterior splint.  He states that he started having discomfort from the splint and called the on-call nurse who told him to unwrap the splint.  States he had wrapped it and removed it and noted that he had a blood-filled blister that was not draining to the medial malleolus.  He denies any new injury.  No new numbness.  No calf tenderness.  Has follow-up with Dr. Lennie scheduled tomorrow afternoon at 3 PM.  Is scheduled for surgical intervention on Friday, 02/12/2024.  States he was prescribed tramadol  at home which is providing minimal relief in his pain.   History provided by patient and wife.    Past Medical History:  Diagnosis Date   Peripheral neuropathy     Past Surgical History:  Procedure Laterality Date   FACIAL RECONSTRUCTION SURGERY     Plastic Surgery of Right Ear      MEDICATIONS:  Prior to Admission medications  Medication Sig Start Date End Date Taking? Authorizing Provider  albuterol  (PROVENTIL  HFA;VENTOLIN  HFA) 108 (90 Base) MCG/ACT inhaler Inhale 2 puffs into the lungs every 6 (six) hours as needed for wheezing or shortness of breath. 01/09/16   Lang Dover, MD  cyclobenzaprine  (FLEXERIL ) 10 MG tablet Take 1 tablet (10 mg total) by mouth 3 (three) times daily as needed for muscle spasms. 01/09/16   Lang Dover, MD  methocarbamol  (ROBAXIN ) 500 MG tablet Take 1 tablet (500 mg total) by mouth every 8 (eight) hours as needed. 05/02/19   Magnant, Charles L, PA-C  traMADol   (ULTRAM ) 50 MG tablet Take 1 tablet (50 mg total) by mouth every 6 (six) hours as needed for up to 5 days. 02/07/24 02/12/24  Sheron Salm, PA-C    Physical Exam   Triage Vital Signs: ED Triage Vitals  Encounter Vitals Group     BP 02/10/24 0040 133/82     Girls Systolic BP Percentile --      Girls Diastolic BP Percentile --      Boys Systolic BP Percentile --      Boys Diastolic BP Percentile --      Pulse Rate 02/10/24 0040 88     Resp 02/10/24 0040 18     Temp 02/10/24 0040 97.9 F (36.6 C)     Temp src --      SpO2 02/10/24 0040 98 %     Weight 02/10/24 0039 275 lb (124.7 kg)     Height 02/10/24 0039 6' 3 (1.905 m)     Head Circumference --      Peak Flow --      Pain Score 02/10/24 0039 8     Pain Loc --      Pain Education --      Exclude from Growth Chart --     Most recent vital signs: Vitals:   02/10/24 0040  BP: 133/82  Pulse: 88  Resp: 18  Temp: 97.9 F (36.6 C)  SpO2: 98%  CONSTITUTIONAL: Alert and responds appropriately to questions. Well-appearing; well-nourished HEAD: Normocephalic, atraumatic EYES: Conjunctivae clear, pupils appear equal ENT: normal nose; moist mucous membranes NECK: Normal range of motion CARD: Regular rate and rhythm RESP: Normal chest excursion without splinting or tachypnea; no hypoxia or respiratory distress, speaking full sentences ABD/GI: non-distended EXT: Tenderness over the distal right ankle.  He has associated soft tissue swelling and ecchymosis.  Increased swelling in the right leg compared to the left but no calf tenderness.  Compartments in the right leg are soft and he has normal capillary refill.  Extremity is warm and well-perfused.  He does have a 3 x 2 cm fracture blister noted to the medial ankle that is not actively bleeding and there is no surrounding redness or significantly increased warmth.  No induration noted. SKIN: Normal color for age and race, no rashes on exposed skin NEURO: Moves all extremities  equally, normal speech, no facial asymmetry noted PSYCH: The patient's mood and manner are appropriate. Grooming and personal hygiene are appropriate.  ED Results / Procedures / Treatments   LABS: (all labs ordered are listed, but only abnormal results are displayed) Labs Reviewed - No data to display   EKG:   RADIOLOGY: My personal review and interpretation of imaging: X-ray shows no acute change.  I have personally reviewed all radiology reports. DG Ankle Complete Right Result Date: 02/10/2024 EXAM: 3 OR MORE VIEW(S) XRAY OF THE RIGHT ANKLE 02/10/2024 01:29:53 AM CLINICAL HISTORY: Recent fracture, now with deformity. COMPARISON: 02/07/2024 FINDINGS: BONES AND JOINTS: Oblique fracture of the distal fibula with minimal lateral displacement of the distal fragment. The fracture line extends into the tibiofibular syndesmosis at the level involving the ankle mortise. Mild widening of the medial ankle mortise. SOFT TISSUES: Soft tissue swelling about the ankle. IMPRESSION: 1. No change in oblique distal fibular fracture compared to 2 / 1 / 26. Electronically signed by: Norman Gatlin MD 02/10/2024 01:37 AM EST RP Workstation: [id number]     PROCEDURES:  Critical Care performed: No  SPLINT APPLICATION  Authorized by: Josette Askia Hazelip Consent: Verbal consent obtained. Risks and benefits: risks, benefits and alternatives were discussed Consent given by: patient Splint applied by: technician Location details: Right leg Splint type: Posterior short leg Supplies used: Ortho-Glass, padding, Ace wrap Post-procedure: The splinted body part was neurovascularly unchanged following the procedure. Patient tolerance: Patient tolerated the procedure well with no immediate complications.     Procedures    IMPRESSION / MDM / ASSESSMENT AND PLAN / ED COURSE  I reviewed the triage vital signs and the nursing notes.   Patient here with increasing discomfort and fracture blister to the right  ankle.     DIFFERENTIAL DIAGNOSIS (includes but not limited to):   Fracture blister, discomfort from posterior splint, pain from known distal fibular fracture.  No signs of compartment syndrome or arterial obstruction.  Doubt DVT.  No signs of cellulitis.  Patient's presentation is most consistent with acute complicated illness / injury requiring diagnostic workup.  PLAN: Splint, Ace wrap removed and patient's leg is warm and well-perfused.  He does have a fracture blister that is not bleeding to the medial malleolus.  X-ray repeated here and reviewed and interpreted by myself and radiologist and shows no acute change.  Skin is otherwise intact without any sign of open wounds.  Clinically no signs of cellulitis.  No indication to start him on antibiotics.  Will pad the fracture blister prior to replacing his posterior splint and Ace wrap here.  He has chronic neuropathy here so it is difficult to evaluate his sensation but denies that anything is worse than normal and his compartments feel soft.  He states that tramadol  is not helping his pain.  Will provide him with oxycodone  here and a brief prescription of the same.  Advised him to continue to use his crutches, continue to be nonweightbearing on the right lower extremity, keep the leg elevated at rest, keep the splint on at all times and follow-up with Dr. Lennie as scheduled today at 3 PM.   MEDICATIONS GIVEN IN ED: Medications  oxyCODONE  (Oxy IR/ROXICODONE ) immediate release tablet 10 mg (10 mg Oral Given 02/10/24 0217)  ondansetron  (ZOFRAN -ODT) disintegrating tablet 4 mg (4 mg Oral Given 02/10/24 0218)     ED COURSE:  At this time, I do not feel there is any life-threatening condition present. I reviewed all nursing notes, vitals, pertinent previous records.  All lab and urine results, EKGs, imaging ordered have been independently reviewed and interpreted by myself.  I reviewed all available radiology reports from any imaging  ordered this visit.  Based on my assessment, I feel the patient is safe to be discharged home without further emergent workup and can continue workup as an outpatient as needed. Discussed all findings, treatment plan as well as usual and customary return precautions.  They verbalize understanding and are comfortable with this plan.  Outpatient follow-up has been provided as needed.  All questions have been answered.    CONSULTS:  none   OUTSIDE RECORDS REVIEWED: Reviewed last admission in May 2025.     FINAL CLINICAL IMPRESSION(S) / ED DIAGNOSES   Final diagnoses:  Blister of right ankle, initial encounter  Closed fracture of distal end of right fibula with routine healing, unspecified fracture morphology, subsequent encounter     Rx / DC Orders   ED Discharge Orders          Ordered    oxyCODONE  (ROXICODONE ) 5 MG immediate release tablet  Every 8 hours PRN        02/10/24 0218    ondansetron  (ZOFRAN -ODT) 4 MG disintegrating tablet  Every 6 hours PRN        02/10/24 0218             Note:  This document was prepared using Dragon voice recognition software and may include unintentional dictation errors.   Layce Sprung, Josette SAILOR, DO 02/10/24 [phone]  "

## 2024-02-10 NOTE — ED Triage Notes (Signed)
 Pt reports he was here a few days ago for a fall and pt fractured right ankle. Pt reports he has appointment Friday for surgery, pt was adjusting bandage at home tonight and noticed a large swollen area that is bruised to the inside of same ankle, pt reports pain.

## 2024-02-11 ENCOUNTER — Other Ambulatory Visit: Payer: Self-pay | Admitting: Podiatry

## 2024-02-12 ENCOUNTER — Ambulatory Visit: Payer: Self-pay

## 2024-02-12 ENCOUNTER — Ambulatory Visit
Admission: RE | Admit: 2024-02-12 | Discharge: 2024-02-12 | Disposition: A | Payer: Self-pay | Source: Home / Self Care | Attending: Podiatry | Admitting: Podiatry

## 2024-02-12 ENCOUNTER — Encounter: Payer: Self-pay | Admitting: Podiatry

## 2024-02-12 ENCOUNTER — Other Ambulatory Visit: Payer: Self-pay

## 2024-02-12 ENCOUNTER — Ambulatory Visit: Payer: Self-pay | Admitting: Certified Registered"

## 2024-02-12 ENCOUNTER — Encounter: Admission: RE | Disposition: A | Payer: Self-pay | Source: Home / Self Care | Attending: Podiatry

## 2024-02-12 DIAGNOSIS — S82841A Displaced bimalleolar fracture of right lower leg, initial encounter for closed fracture: Secondary | ICD-10-CM

## 2024-02-12 MED ORDER — BUPIVACAINE LIPOSOME 1.3 % IJ SUSP
INTRAMUSCULAR | Status: AC
  Filled 2024-02-12: qty 20

## 2024-02-12 MED ORDER — PROPOFOL 1000 MG/100ML IV EMUL
INTRAVENOUS | Status: AC
  Filled 2024-02-12: qty 100

## 2024-02-12 MED ORDER — FENTANYL CITRATE (PF) 100 MCG/2ML IJ SOLN
INTRAMUSCULAR | Status: AC
  Filled 2024-02-12: qty 2

## 2024-02-12 MED ORDER — LACTATED RINGERS IV SOLN: INTRAVENOUS | Status: DC

## 2024-02-12 MED ORDER — KETAMINE HCL 50 MG/5ML IJ SOSY
PREFILLED_SYRINGE | INTRAMUSCULAR | Status: AC
  Filled 2024-02-12: qty 5

## 2024-02-12 MED ORDER — KETAMINE HCL 50 MG/5ML IJ SOSY
PREFILLED_SYRINGE | INTRAMUSCULAR | Status: DC | PRN
  Administered 2024-02-12: 20 mg via INTRAVENOUS

## 2024-02-12 MED ORDER — DROPERIDOL 2.5 MG/ML IJ SOLN: 0.6250 mg | Freq: Once | INTRAMUSCULAR | Status: DC | PRN

## 2024-02-12 MED ORDER — OXYCODONE HCL 5 MG PO TABS: 5.0000 mg | ORAL_TABLET | Freq: Once | ORAL | Status: DC | PRN

## 2024-02-12 MED ORDER — PROPOFOL 10 MG/ML IV BOLUS
INTRAVENOUS | Status: AC
  Filled 2024-02-12: qty 20

## 2024-02-12 MED ORDER — ACETAMINOPHEN 10 MG/ML IV SOLN: 1000.0000 mg | Freq: Once | INTRAVENOUS | Status: DC | PRN

## 2024-02-12 MED ORDER — OXYCODONE HCL 5 MG/5ML PO SOLN: 5.0000 mg | Freq: Once | ORAL | Status: DC | PRN

## 2024-02-12 MED ORDER — MIDAZOLAM HCL (PF) 2 MG/2ML IJ SOLN
INTRAMUSCULAR | Status: DC | PRN
  Administered 2024-02-12 (×2): 2 mg via INTRAVENOUS

## 2024-02-12 MED ORDER — CHLORHEXIDINE GLUCONATE 0.12 % MT SOLN
OROMUCOSAL | Status: AC
  Filled 2024-02-12: qty 15

## 2024-02-12 MED ORDER — FENTANYL CITRATE (PF) 100 MCG/2ML IJ SOLN
INTRAMUSCULAR | Status: DC | PRN
  Administered 2024-02-12 (×2): 50 ug via INTRAVENOUS

## 2024-02-12 MED ORDER — OXYCODONE-ACETAMINOPHEN 10-325 MG PO TABS: 1.0000 | ORAL_TABLET | Freq: Four times a day (QID) | ORAL | 0 refills | Status: AC | PRN

## 2024-02-12 MED ORDER — VANCOMYCIN HCL IN DEXTROSE 1-5 GM/200ML-% IV SOLN
INTRAVENOUS | Status: AC
  Filled 2024-02-12: qty 200

## 2024-02-12 MED ORDER — CHLORHEXIDINE GLUCONATE 0.12 % MT SOLN
15.0000 mL | Freq: Once | OROMUCOSAL | Status: AC
  Administered 2024-02-12: 15 mL via OROMUCOSAL

## 2024-02-12 MED ORDER — MIDAZOLAM HCL 2 MG/2ML IJ SOLN
INTRAMUSCULAR | Status: AC
  Filled 2024-02-12: qty 2

## 2024-02-12 MED ORDER — BUPIVACAINE HCL (PF) 0.5 % IJ SOLN
INTRAMUSCULAR | Status: AC
  Filled 2024-02-12: qty 20

## 2024-02-12 MED ORDER — ORAL CARE MOUTH RINSE: 15.0000 mL | Freq: Once | OROMUCOSAL | Status: AC

## 2024-02-12 MED ORDER — BUPIVACAINE HCL (PF) 0.5 % IJ SOLN
INTRAMUSCULAR | Status: DC | PRN
  Administered 2024-02-12: 20 mL via PERINEURAL

## 2024-02-12 MED ORDER — FENTANYL CITRATE (PF) 50 MCG/ML IJ SOSY
PREFILLED_SYRINGE | INTRAMUSCULAR | Status: AC
  Filled 2024-02-12: qty 1

## 2024-02-12 MED ORDER — PROPOFOL 500 MG/50ML IV EMUL
INTRAVENOUS | Status: DC | PRN
  Administered 2024-02-12: 75 ug/kg/min via INTRAVENOUS

## 2024-02-12 MED ORDER — ASPIRIN 81 MG PO TBEC: 81.0000 mg | DELAYED_RELEASE_TABLET | Freq: Two times a day (BID) | ORAL | 0 refills | Status: AC

## 2024-02-12 MED ORDER — FENTANYL CITRATE (PF) 100 MCG/2ML IJ SOLN: 25.0000 ug | INTRAMUSCULAR | Status: DC | PRN

## 2024-02-12 MED ORDER — BUPIVACAINE LIPOSOME 1.3 % IJ SUSP
INTRAMUSCULAR | Status: DC | PRN
  Administered 2024-02-12: 20 mL via PERINEURAL

## 2024-02-12 MED ORDER — DOXYCYCLINE HYCLATE 100 MG PO TABS: 100.0000 mg | ORAL_TABLET | Freq: Two times a day (BID) | ORAL | 0 refills | Status: AC

## 2024-02-12 MED ORDER — VANCOMYCIN HCL IN DEXTROSE 1-5 GM/200ML-% IV SOLN
1000.0000 mg | Freq: Once | INTRAVENOUS | Status: AC
  Administered 2024-02-12: 1000 mg via INTRAVENOUS

## 2024-02-12 MED ORDER — 0.9 % SODIUM CHLORIDE (POUR BTL) OPTIME
TOPICAL | Status: DC | PRN
  Administered 2024-02-12: 500 mL

## 2024-02-12 NOTE — Transfer of Care (Signed)
 Immediate Anesthesia Transfer of Care Note  Patient: Douglas Braun  Procedure(s) Performed: OPEN REDUCTION INTERNAL FIXATION (ORIF) ANKLE FRACTURE (Right: Ankle)  Patient Location: PACU  Anesthesia Type:General  Level of Consciousness: drowsy and patient cooperative  Airway & Oxygen Therapy: Patient Spontanous Breathing and Patient connected to face mask oxygen  Post-op Assessment: Report given to RN and Post -op Vital signs reviewed and stable  Post vital signs: Reviewed and stable  Last Vitals:  Vitals Value Taken Time  BP 121/75 02/12/24 14:25  Temp    Pulse 82 02/12/24 14:26  Resp 16 02/12/24 14:26  SpO2 97 % 02/12/24 14:26  Vitals shown include unfiled device data.  Last Pain:  Vitals:   02/12/24 1119  TempSrc: Temporal  PainSc: 5          Complications: No notable events documented.

## 2024-02-12 NOTE — Discharge Instructions (Signed)
 [facility]  POST OPERATIVE INSTRUCTIONS FOR DR. ASHLEY AND DR. Reah Justo [facility] CLIN[facility] PODIATRY DEPARTMENT   Take your medication as prescribed.  Pain medication should be taken only as needed.  May take additional Tylenol  as needed.  If pain is still severe then may take 2 pain tablets every 6 hours.  If still severe then try taking 2 pain tablets every 4 hours.  Try to take pain medication sparingly.  Keep the dressing clean, dry and intact.  Remain nonweightbearing to the right lower extremity at all times.  Do not manipulate the dressings.  Keep your foot elevated above the heart level.  May apply ice to the right ankle for maximum 10 minutes on every 1 hour as needed.  Walking to the bathroom and brief periods of walking are acceptable, unless we have instructed you to be non-weight bearing.  Always wear your post-op shoe when walking.  Always use your crutches if you are to be non-weight bearing.  Do not take a shower. Baths are permissible as long as the foot is kept out of the water.   Every hour you are awake:  Bend your knee 15 times. Massage calf 15 times  Call [facility] [phone]) if any of the following problems occur: You develop a temperature or fever. The bandage becomes saturated with blood. Medication does not stop your pain. Injury of the foot occurs. Any symptoms of infection including redness, odor, or red streaks running from wound.

## 2024-02-12 NOTE — Op Note (Signed)
 PODIATRY / FOOT AND ANKLE SURGERY OPERATIVE REPORT    SURGEON: Prentice Lee, DPM  PRE-OPERATIVE DIAGNOSIS:  1.  Right bimalleolar closed displaced ankle fracture 2.  Right syndesmotic disruption  POST-OPERATIVE DIAGNOSIS: Same  PROCEDURE(S): Right bimalleolar ankle fracture open reduction with internal fixation Open repair of right syndesmotic ligament disruption Use of intraoperative fluoroscopy without radiology assistance  HEMOSTASIS: Right thigh tourniquet  ANESTHESIA: general  ESTIMATED BLOOD LOSS: 30 cc  FINDING(S): 1.  Displaced comminuted distal fibular fracture with syndesmotic ligament instability  PATHOLOGY/SPECIMEN(S): None  INDICATIONS:   Douglas Braun is a 59 y.o. male who presents with right ankle pain after sustaining a fall.  Patient went to the emergency room this past weekend due to the injury and he was noted to have a closed displaced bimalleolar ankle fracture with widening of the medial clear space indicative of likely syndesmotic injury.  Patient was placed into a posterior splint.  Patient was then seen in the emergency room a few days after due to increased pain to the foot where he was noted to have a fracture blister on the medial heel area.  He was then seen in office for further evaluation.  All treatment options were discussed with the patient of both conservative and surgical attempts at correction including potential risks and complications.  Patient has elected for procedure consisting of right bimalleolar ankle fracture open reduction with internal fixation with syndesmotic ligament repair.  No guarantees given.  Consent obtained.  Discussed increased postoperative risk with smoking and recommended smoking cessation immediately.  Patient also has history of neuropathy as well.  DESCRIPTION: After obtaining full informed written consent, the patient was brought back to the operating room and placed supine upon the operating table.  The patient  received IV antibiotics prior to induction.  After obtaining adequate anesthesia, the patient was prepped and draped in the standard fashion.  A preoperative popliteal and saphenous nerve block was performed by anesthesia.  C-arm imaging was utilized to verify incision placement over the lateral ankle.  Imaging was taken showing the close displaced lateral malleolar fracture as well as minimally displaced posterior malleolar fracture.  Medial clear space widening also noted.  An Esmarch bandage was used to exsanguinate the right lower extremity and pneumatic thigh tourniquet was inflated.  Attention was then directed to the right lateral ankle where a linear longitudinal incision was made over the lateral malleolus extending to the distal fibular shaft.  The incision was deepened through the subcutaneous tissues utilizing sharp and blunt dissection and care was taken to identify and retract all vital neurovascular structures and all venous contributories were cauterized as necessary.  At this time an incision was made into the periosteum of the fibula and lateral malleolus.  The periosteum was reflected anteriorly and posteriorly thereby exposing the distal fibula and lateral malleolus at the operative site.  The fracture was easily able to be seen as the periosteum appeared to be disrupted in this area.  The periosteum was carefully dissected free from the fracture fragments to visualize the fracture anatomy better.  The posterior fibula appeared to be comminuted at the level of the fracture site.  Some small bony fragments were removed and passed off the operative site at the area.  The fracture site was cleared of any large hematoma or bony debris.  At this time a reduction clamp was then used and clamped onto the distal lateral malleolus area.  This was used to pull the fragment out to length while  inverting the foot.  An second reduction clamp was then used over the area of the fracture to reduce the  fracture and hold it in place.  This appeared to fit in near anatomic alignment clinically.  X-ray imaging was utilized to verify correction which appeared to be excellent.  The fibula appeared to be out to length but there still appeared to be some medial clear space widening indicative of likely syndesmotic injury.  Overall near anatomic alignment was noted to the fibula.  At this time a 11 hole Paragon 28 distal fibular locking plate was then applied and held with temporary fixation.  This was checked under fluoroscopic guidance in the AP and lateral views and appeared to be in the appropriate placement.  At this time for 2.7 mm Paragon 28 locking screws then placed distal to the fracture site in the lateral malleolus of the appropriate size and length utilizing standard AO principles and techniques.  Three 3.5 mm locking screws were placed proximal to the fracture site utilizing standard AO principles and techniques.  During this process the temporary olive wires were removed.  The fibula appeared to be fixated well with good compression overall noted across the fracture site and near anatomic alignment on AP and lateral views.    2 holes then slightly distal to the fracture were then used for syndesmotic fixation.  A separate incision was made at the medial ankle over the distal tibia and blunt dissection was continued down to bone.  At this time the syndesmotic reduction clamp was then used to reduce the syndesmotic disruption and medial clear space.  This was placed in the appropriate orientation and clamped in place.  C-arm imaging was utilized to verify correction which appeared to be excellent.  The tib-fib overlap appeared to be greatly improved and medial clear space appeared to be reduced within normal limits.  At this time to 3.5 mm fully threaded solid core Paragon 28 screws were then placed utilizing standard AO principles techniques across the fibula and into the tibia slightly proximal to the  ankle joint through the appropriate corresponding holes.  This was done under fluoroscopic guidance to make the screws is parallel to each other as possible.  The screws were then placed and overall good compression was noted with tricortical fixation.  The ankle was then brought through range of motion and overall did not appear to lose any correction.  Varus and valgus stress testing was then performed and there did not appear to be any instability to the deltoid ligament at this time or lateral ankle ligaments.  The posterior fibular fracture fragment appeared to be in near anatomic alignment.  The fibula appeared to be out to length with the ankle overall and near anatomic position.  The surgical sites were flushed with copious amounts normal sterile saline.  The periosteal and capsular structures were reapproximated well coapted with 2-0 Vicryl.  The subcutaneous tissue was reapproximated well coapted with a combination of 2-0 and 3-0 Vicryl.  The skin was then reapproximated well coapted with skin stapler at both incision sites.  The pneumatic thigh tourniquet was deflated and a prompt hyperemic response was noted all digits of the right foot.  A postoperative dressing was then applied consisting of Xeroform followed by 4 x 4 gauze, gauze roll, ABD, Webril, posterior and sugar-tong splint, Ace wrap's.  The patient tolerated the procedure and anesthesia well and was transferred to the recovery room with vital signs stable and vascular status intact to all  toes of the right foot.  Following a period of postoperative monitoring the patient be discharged home with the appropriate orders, instructions, and medications.  Patient is to remain nonweightbearing at all times to the right lower extremity.  COMPLICATIONS: None  CONDITION: Good, stable  Prentice Lee, DPM

## 2024-02-12 NOTE — Anesthesia Procedure Notes (Signed)
 Anesthesia Regional Block: Popliteal block   Pre-Anesthetic Checklist: , timeout performed,  Correct Patient, Correct Site, Correct Laterality,  Correct Procedure, Correct Position, site marked,  Risks and benefits discussed,  Surgical consent,  Pre-op evaluation,  At surgeon's request and post-op pain management  Laterality: Right  Prep: chloraprep       Needles:  Injection technique: Single-shot  Needle Type: Stimiplex     Needle Length: 9cm  Needle Gauge: 22     Additional Needles:   Procedures:,,,, ultrasound used (permanent image in chart),,    Narrative:  Start time: 02/12/2024 12:06 PM End time: 02/12/2024 12:08 PM Injection made incrementally with aspirations every 5 mL.  Performed by: Personally  Anesthesiologist: Vicci Camellia Glatter, MD  Additional Notes: Patient consented for risk and benefits of nerve block including but not limited to nerve damage, failed block, bleeding and infection.  Patient voiced understanding.  Functioning IV was confirmed and monitors were applied.  Timeout done prior to procedure and prior to any sedation being given to the patient.  Patient confirmed procedure site prior to any sedation given to the patient. Sterile prep,hand hygiene and sterile gloves were used.  Minimal sedation used for procedure.  No paresthesia endorsed by patient during the procedure.  Negative aspiration and negative test dose prior to incremental administration of local anesthetic. The patient tolerated the procedure well with no immediate complications.

## 2024-02-12 NOTE — Anesthesia Procedure Notes (Signed)
 Anesthesia Regional Block: Adductor canal block   Pre-Anesthetic Checklist: , timeout performed,  Correct Patient, Correct Site, Correct Laterality,  Correct Procedure, Correct Position, site marked,  Risks and benefits discussed,  Surgical consent,  Pre-op evaluation,  At surgeon's request and post-op pain management  Laterality: Right  Prep: chloraprep       Needles:  Injection technique: Single-shot  Needle Type: Stimiplex     Needle Length: 9cm  Needle Gauge: 22     Additional Needles:   Procedures:,,,, ultrasound used (permanent image in chart),,    Narrative:  Start time: 02/12/2024 12:02 PM End time: 02/12/2024 12:04 PM Injection made incrementally with aspirations every 5 mL.  Performed by: Personally  Anesthesiologist: Vicci Camellia Glatter, MD  Additional Notes: Patient consented for risk and benefits of nerve block including but not limited to nerve damage, failed block, bleeding and infection.  Patient voiced understanding.  Functioning IV was confirmed and monitors were applied.  Timeout done prior to procedure and prior to any sedation being given to the patient.  Patient confirmed procedure site prior to any sedation given to the patient. Sterile prep,hand hygiene and sterile gloves were used.  Minimal sedation used for procedure.  No paresthesia endorsed by patient during the procedure.  Negative aspiration and negative test dose prior to incremental administration of local anesthetic. The patient tolerated the procedure well with no immediate complications.

## 2024-02-12 NOTE — Anesthesia Preprocedure Evaluation (Addendum)
"                                    Anesthesia Evaluation  Patient identified by MRN, date of birth, ID band Patient awake    Reviewed: Allergy & Precautions, H&P , NPO status , Patient's Chart, lab work & pertinent test results  Airway Mallampati: III  TM Distance: >3 FB Neck ROM: full    Dental  (+) Poor Dentition, Edentulous Upper, Missing, Chipped   Pulmonary Current Smoker and Patient abstained from smoking.    + wheezing      Cardiovascular negative cardio ROS Normal cardiovascular exam     Neuro/Psych Peripheral neuropathy  negative psych ROS   GI/Hepatic negative GI ROS, Neg liver ROS,,,  Endo/Other  negative endocrine ROS    Renal/GU negative Renal ROS  negative genitourinary   Musculoskeletal   Abdominal  (+) + obese  Peds  Hematology negative hematology ROS (+)   Anesthesia Other Findings Past Medical History: No date: Ankle fracture, right No date: Peripheral neuropathy  Past Surgical History: No date: FACIAL RECONSTRUCTION SURGERY     Comment:  as a child No date: KNEE SURGERY; Left No date: Plastic Surgery of Right Ear     Reproductive/Obstetrics negative OB ROS                              Anesthesia Physical Anesthesia Plan  ASA: 3  Anesthesia Plan: General   Post-op Pain Management: Regional block*   Induction: Intravenous  PONV Risk Score and Plan: Propofol  infusion and TIVA  Airway Management Planned: Natural Airway  Additional Equipment:   Intra-op Plan:   Post-operative Plan:   Informed Consent: I have reviewed the patients History and Physical, chart, labs and discussed the procedure including the risks, benefits and alternatives for the proposed anesthesia with the patient or authorized representative who has indicated his/her understanding and acceptance.     Dental Advisory Given  Plan Discussed with: CRNA and Surgeon  Anesthesia Plan Comments:          Anesthesia  Quick Evaluation  "

## 2024-02-12 NOTE — H&P (Signed)
 HISTORY AND PHYSICAL INTERVAL NOTE:  02/12/2024  12:23 PM  Douglas Braun  has presented today for surgery, with the diagnosis of Closed fracture of distal end of right fibula, unspecified fracture morphology, initial encounter S82.831A Closed displaced fracture of medial malleolus of right tibia, initial encounter S82.51XA.  The various methods of treatment have been discussed with the patient.  No guarantees were given.  After consideration of risks, benefits and other options for treatment, the patient has consented to surgery.  I have reviewed the patients chart and labs.    PROCEDURE: ALL RIGHT ANKLE BIMALLEOLAR ANKLE FRACTURE ORIF POSSIBLE SYDESMOTIC LIGAMENT REPAIR POSSIBLE DELTOID LIGAMENT REPAIR   A history and physical examination was performed in my office.  The patient was reexamined.  There have been no changes to this history and physical examination.  Douglas Braun, DPM
